# Patient Record
Sex: Male | Born: 1960 | Race: Black or African American | Hispanic: No | Marital: Single | Smoking: Never smoker
Health system: Southern US, Community
[De-identification: ages and names within clinical notes are randomized; demographics above are authoritative.]

## PROBLEM LIST (undated history)

## (undated) DIAGNOSIS — N529 Male erectile dysfunction, unspecified: Secondary | ICD-10-CM

## (undated) DIAGNOSIS — B1920 Unspecified viral hepatitis C without hepatic coma: Secondary | ICD-10-CM

## (undated) DIAGNOSIS — I10 Essential (primary) hypertension: Secondary | ICD-10-CM

## (undated) HISTORY — PX: CLOSED MANIPULATION SHOULDER: SUR205

---

## 2015-12-14 ENCOUNTER — Encounter: Payer: Self-pay | Admitting: Emergency Medicine

## 2015-12-14 ENCOUNTER — Emergency Department
Admission: EM | Admit: 2015-12-14 | Discharge: 2015-12-14 | Disposition: A | Discharge status: Home / Self Care | Payer: BLUE CROSS/BLUE SHIELD | Attending: Emergency Medicine | Admitting: Emergency Medicine

## 2015-12-14 ENCOUNTER — Emergency Department: Payer: BLUE CROSS/BLUE SHIELD

## 2015-12-14 DIAGNOSIS — S8011XD Contusion of right lower leg, subsequent encounter: Secondary | ICD-10-CM | POA: Insufficient documentation

## 2015-12-14 DIAGNOSIS — Y929 Unspecified place or not applicable: Secondary | ICD-10-CM | POA: Insufficient documentation

## 2015-12-14 DIAGNOSIS — Y999 Unspecified external cause status: Secondary | ICD-10-CM | POA: Diagnosis not present

## 2015-12-14 DIAGNOSIS — Y939 Activity, unspecified: Secondary | ICD-10-CM | POA: Insufficient documentation

## 2015-12-14 DIAGNOSIS — L989 Disorder of the skin and subcutaneous tissue, unspecified: Secondary | ICD-10-CM

## 2015-12-14 DIAGNOSIS — S8991XA Unspecified injury of right lower leg, initial encounter: Secondary | ICD-10-CM | POA: Diagnosis present

## 2015-12-14 MED ORDER — OXYCODONE HCL 5 MG PO TABS: 5.0000 mg | ORAL_TABLET | Freq: Three times a day (TID) | ORAL | Status: DC | PRN

## 2015-12-14 MED ORDER — KETOROLAC TROMETHAMINE 60 MG/2ML IM SOLN
30.0000 mg | Freq: Once | INTRAMUSCULAR | Status: AC
  Administered 2015-12-14: 30 mg via INTRAMUSCULAR
  Filled 2015-12-14: qty 2

## 2015-12-14 NOTE — ED Notes (Signed)
See triage   States he hit a tailgate with right lower leg couple of days ago. Was seen at [facility]  At that time  Was told he bruised the leg  Area still swollen and tender

## 2015-12-14 NOTE — ED Provider Notes (Signed)
[facility] Emergency Department Provider Note ____________________________________________  Time seen: Approximately 3:04 PM  I have reviewed the triage vital signs and the nursing notes.   HISTORY  Chief Complaint Leg Pain    HPI Andrew Hodge is a 55 y.o. male who presents to the emergency department for evaluation of right lower extremity pain. He states that he felt off the tailgate of a truck and struck his right lower leg about a week ago. He states that he was evaluated at [facility] clinic after the fall and was advised that he had a hematoma. His leg was wrapped in an Ace bandage, he was given crutches, and a prescription for Vicodin at that time. He states that the leg is still swelling and tender and the Vicodin is not relieving the pain. He denies new symptoms or new injury.  History reviewed. No pertinent past medical history.  There are no active problems to display for this patient.   History reviewed. No pertinent past surgical history.  Current Outpatient Rx  Name  Route  Sig  Dispense  Refill  . oxyCODONE (ROXICODONE) 5 MG immediate release tablet   Oral   Take 1 tablet (5 mg total) by mouth every 8 (eight) hours as needed.   12 tablet   0     Allergies Review of patient's allergies indicates no known allergies.  No family history on file.  Social History Social History  Substance Use Topics  . Smoking status: Never Smoker   . Smokeless tobacco: None  . Alcohol Use: No    Review of Systems Constitutional: No recent illness. Cardiovascular: Denies chest pain or palpitations. Respiratory: Denies shortness of breath. Musculoskeletal: Pain in Right lower extremity Skin: Negative for rash, wound, lesion. Neurological: Negative for focal weakness or numbness.  ____________________________________________   PHYSICAL EXAM:  VITAL SIGNS: ED Triage Vitals  Enc Vitals Group     BP 12/14/15 1221 149/94 mmHg     Pulse Rate  12/14/15 1221 56     Resp 12/14/15 1221 18     Temp 12/14/15 1221 98.1 F (36.7 C)     Temp Source 12/14/15 1221 Oral     SpO2 12/14/15 1221 99 %     Weight 12/14/15 1221 268 lb (121.564 kg)     Height 12/14/15 1221 5\' 7"  (1.702 m)     Head Cir --      Peak Flow --      Pain Score 12/14/15 1222 8     Pain Loc --      Pain Edu? --      Excl. in [place]? --     Constitutional: Alert and oriented. Well appearing and in no acute distress. Eyes: Conjunctivae are normal. EOMI. Head: Atraumatic. Neck: No stridor.  Respiratory: Normal respiratory effort.   Musculoskeletal: Nontender mobile lesion palpable on the right pretibial area consistent with reported injury. Neurologic:  Normal speech and language. No gross focal neurologic deficits are appreciated. Speech is normal. No gait instability. Skin: Healing abrasion noted central to the non-mobile lesion of the right lower extremity. No associated erythema. Lesion is nonfluctuant. Psychiatric: Mood and affect are normal. Speech and behavior are normal.  ____________________________________________   LABS (all labs ordered are listed, but only abnormal results are displayed)  Labs Reviewed - No data to display ____________________________________________  RADIOLOGY  4x 1 x 3 cm deep subcutaneous mass consistent with hematoma. ____________________________________________   PROCEDURES  Procedure(s) performed: None   ____________________________________________   INITIAL  IMPRESSION / ASSESSMENT AND PLAN / ED COURSE  Pertinent labs & imaging results that were available during my care of the patient were reviewed by me and considered in my medical decision making (see chart for details).  Patient was advised to continue rest, compression, ice and elevation. Patient is displeased that I am unable to make his leg better immediately with medication. Patient was advised to follow-up with the primary care provider to ensure resolution  of the hematoma. He was advised to return to the emergency department for symptoms that change or worsen if he is unable to schedule an appointment. Today he'll be given a prescription for Roxicet and advised to stop taking the Vicodin. ____________________________________________   FINAL CLINICAL IMPRESSION(S) / ED DIAGNOSES  Final diagnoses:  Skin lesion of right lower extremity  Hematoma of right lower extremity, subsequent encounter       Victorino Dike, FNP 12/14/15 [place] Yao, MD 12/18/15 [phone]

## 2015-12-14 NOTE — ED Notes (Signed)
Pt in w/c  Ace wrap applied to right lower leg  Explained the wait . States he was not going to stay much longer  PA aware

## 2015-12-14 NOTE — ED Notes (Signed)
[place] of tailgate of truck, struck right lower leg , hematoma noted no signs if infection , unable to put complete weight on his leg, uses crutches for comfort, sensation noted distally.

## 2015-12-24 ENCOUNTER — Emergency Department
Admission: EM | Admit: 2015-12-24 | Discharge: 2015-12-24 | Disposition: A | Discharge status: Home / Self Care | Payer: BLUE CROSS/BLUE SHIELD | Attending: Emergency Medicine | Admitting: Emergency Medicine

## 2015-12-24 ENCOUNTER — Encounter: Payer: Self-pay | Admitting: Emergency Medicine

## 2015-12-24 DIAGNOSIS — L03116 Cellulitis of left lower limb: Secondary | ICD-10-CM | POA: Diagnosis not present

## 2015-12-24 DIAGNOSIS — Z79899 Other long term (current) drug therapy: Secondary | ICD-10-CM | POA: Diagnosis not present

## 2015-12-24 DIAGNOSIS — L03119 Cellulitis of unspecified part of limb: Secondary | ICD-10-CM

## 2015-12-24 DIAGNOSIS — L02419 Cutaneous abscess of limb, unspecified: Secondary | ICD-10-CM

## 2015-12-24 DIAGNOSIS — L539 Erythematous condition, unspecified: Secondary | ICD-10-CM | POA: Diagnosis present

## 2015-12-24 MED ORDER — SULFAMETHOXAZOLE-TRIMETHOPRIM 800-160 MG PO TABS: 1.0000 | ORAL_TABLET | Freq: Two times a day (BID) | ORAL | Status: DC

## 2015-12-24 MED ORDER — SULFAMETHOXAZOLE-TRIMETHOPRIM 800-160 MG PO TABS
1.0000 | ORAL_TABLET | Freq: Once | ORAL | Status: AC
  Administered 2015-12-24: 1 via ORAL

## 2015-12-24 MED ORDER — TRAMADOL HCL 50 MG PO TABS: 50.0000 mg | ORAL_TABLET | Freq: Four times a day (QID) | ORAL | Status: DC | PRN

## 2015-12-24 MED ORDER — SULFAMETHOXAZOLE-TRIMETHOPRIM 800-160 MG PO TABS
ORAL_TABLET | ORAL | Status: AC
  Administered 2015-12-24: 1 via ORAL
  Filled 2015-12-24: qty 1

## 2015-12-24 NOTE — ED Notes (Signed)
Abscess R leg x 1 week, some drainage.

## 2015-12-24 NOTE — ED Notes (Signed)
States he fell from back of truck about 1 1/2 weeks ago hit right lower leg. States area is healing except he noticed some drainage yesterday  Area is swollen

## 2015-12-24 NOTE — ED Provider Notes (Addendum)
[facility] Emergency Department Provider Note   ____________________________________________  Time seen: Approximately 5:08 PM  I have reviewed the triage vital signs and the nursing notes.   HISTORY  Chief Complaint Abscess    HPI Andrew Hodge is a 55 y.o. male patient complain of drainage from  resolving hematoma. Patient state he fell from a truck about a week and half ago. Patient was seen in this ED had ultrasound done of the left lower extremity revealed a hematoma. Patient stated most of the swelling has resolved with ice packs and elevation. Patient stated the drainage started yesterday. Patient rates his pain discomfort as a 7/10. Patient is taking ibuprofen for this pain.  History reviewed. No pertinent past medical history.  There are no active problems to display for this patient.   History reviewed. No pertinent past surgical history.  Current Outpatient Rx  Name  Route  Sig  Dispense  Refill  . oxyCODONE (ROXICODONE) 5 MG immediate release tablet   Oral   Take 1 tablet (5 mg total) by mouth every 8 (eight) hours as needed.   12 tablet   0   . sulfamethoxazole-trimethoprim (BACTRIM DS,SEPTRA DS) 800-160 MG tablet   Oral   Take 1 tablet by mouth 2 (two) times daily.   20 tablet   0   . traMADol (ULTRAM) 50 MG tablet   Oral   Take 1 tablet (50 mg total) by mouth every 6 (six) hours as needed.   20 tablet   0     Allergies Review of patient's allergies indicates no known allergies.  No family history on file.  Social History Social History  Substance Use Topics  . Smoking status: Never Smoker   . Smokeless tobacco: None  . Alcohol Use: No    Review of Systems Constitutional: No fever/chills Eyes: No visual changes. ENT: No sore throat. Cardiovascular: Denies chest pain. Respiratory: Denies shortness of breath. Gastrointestinal: No abdominal pain.  No nausea, no vomiting.  No diarrhea.  No  constipation. Genitourinary: Negative for dysuria. Musculoskeletal: Negative for back pain. Skin: Negative for rash. Swelling and drainage Neurological: Negative for headaches, focal weakness or numbness.   ____________________________________________   PHYSICAL EXAM:  VITAL SIGNS: ED Triage Vitals  Enc Vitals Group     BP 12/24/15 1643 176/102 mmHg     Pulse Rate 12/24/15 1643 69     Resp 12/24/15 1643 20     Temp 12/24/15 1643 98.2 F (36.8 C)     Temp Source 12/24/15 1643 Oral     SpO2 12/24/15 1643 98 %     Weight 12/24/15 1643 267 lb (121.11 kg)     Height 12/24/15 1643 5\' 7"  (1.702 m)     Head Cir --      Peak Flow --      Pain Score 12/24/15 1644 7     Pain Loc --      Pain Edu? --      Excl. in [place]? --     Constitutional: Alert and oriented. Well appearing and in no acute distress. Eyes: Conjunctivae are normal. PERRL. EOMI. Head: Atraumatic. Nose: No congestion/rhinnorhea. Mouth/Throat: Mucous membranes are moist.  Oropharynx non-erythematous. Neck: No stridor.  No cervical spine tenderness to palpation. Hematological/Lymphatic/Immunilogical: No cervical lymphadenopathy. Cardiovascular: Normal rate, regular rhythm. Grossly normal heart sounds.  Good peripheral circulation. Respiratory: Normal respiratory effort.  No retractions. Lungs CTAB. Gastrointestinal: Soft and nontender. No distention. No abdominal bruits. No CVA tenderness. Musculoskeletal:  No lower extremity tenderness nor edema.  No joint effusions. Neurologic:  Normal speech and language. No gross focal neurologic deficits are appreciated. No gait instability. Skin:  Skin is warm, dry and intact. No rash noted.Mild edema and erythema left lower leg adjacent to resolving hematoma. Psychiatric: Mood and affect are normal. Speech and behavior are normal.  ____________________________________________   LABS (all labs ordered are listed, but only abnormal results are displayed)  Labs Reviewed - No  data to display ____________________________________________  EKG   ____________________________________________  RADIOLOGY   ____________________________________________   PROCEDURES  Procedure(s) performed: None  Critical Care performed: No  ____________________________________________   INITIAL IMPRESSION / ASSESSMENT AND PLAN / ED COURSE  Pertinent labs & imaging results that were available during my care of the patient were reviewed by me and considered in my medical decision making (see chart for details).  Cellulitis left lower leg. Patient given discharge Instructions. He is given a prescription for Bactrim DS and tramadol. ____________________________________________   FINAL CLINICAL IMPRESSION(S) / ED DIAGNOSES  Final diagnoses:  Cellulitis and abscess of leg, except foot      NEW MEDICATIONS STARTED DURING THIS VISIT:  Discharge Medication List as of 12/24/2015  5:20 PM    START taking these medications   Details  sulfamethoxazole-trimethoprim (BACTRIM DS,SEPTRA DS) 800-160 MG tablet Take 1 tablet by mouth 2 (two) times daily., Starting 12/24/2015, Until Discontinued, Print    traMADol (ULTRAM) 50 MG tablet Take 1 tablet (50 mg total) by mouth every 6 (six) hours as needed., Starting 12/24/2015, Until Tue 12/23/16, Print         Note:  This document was prepared using Dragon voice recognition software and may include unintentional dictation errors.    Sable Feil, PA-C 12/24/15 1719  Earleen Newport, MD 12/24/15 [place], PA-C 12/24/15 1853  Earleen Newport, MD 12/24/15 [phone]

## 2015-12-24 NOTE — Discharge Instructions (Signed)
Cellulitis °Cellulitis is an infection of the skin and the tissue under the skin. The infected area is usually red and tender. This happens most often in the arms and lower legs. °HOME CARE  °· Take your antibiotic medicine as told. Finish the medicine even if you start to feel better. °· Keep the infected arm or leg raised (elevated). °· Put a warm cloth on the area up to 4 times per day. °· Only take medicines as told by your doctor. °· Keep all doctor visits as told. °GET HELP IF: °· You see red streaks on the skin coming from the infected area. °· Your red area gets bigger or turns a dark color. °· Your bone or joint under the infected area is painful after the skin heals. °· Your infection comes back in the same area or different area. °· You have a puffy (swollen) bump in the infected area. °· You have new symptoms. °· You have a fever. °GET HELP RIGHT AWAY IF:  °· You feel very sleepy. °· You throw up (vomit) or have watery poop (diarrhea). °· You feel sick and have muscle aches and pains. °  °This information is not intended to replace advice given to you by your health care provider. Make sure you discuss any questions you have with your health care provider. °  °Document Released: 12/24/2007 Document Revised: 03/28/2015 Document Reviewed: 09/22/2011 °Elsevier Interactive Patient Education ©2016 Elsevier Inc. ° °

## 2016-06-04 ENCOUNTER — Ambulatory Visit
Admission: EM | Admit: 2016-06-04 | Discharge: 2016-06-04 | Disposition: A | Discharge status: Home / Self Care | Payer: BLUE CROSS/BLUE SHIELD | Attending: Family Medicine | Admitting: Family Medicine

## 2016-06-04 DIAGNOSIS — N528 Other male erectile dysfunction: Secondary | ICD-10-CM | POA: Diagnosis not present

## 2016-06-04 HISTORY — DX: Male erectile dysfunction, unspecified: N52.9

## 2016-06-04 MED ORDER — TADALAFIL 5 MG PO TABS: 5.0000 mg | ORAL_TABLET | Freq: Every day | ORAL | 0 refills | Status: DC | PRN

## 2016-06-04 MED ORDER — TADALAFIL 5 MG PO TABS
5.0000 mg | ORAL_TABLET | Freq: Every day | ORAL | 0 refills | Status: DC | PRN
Start: 2016-06-04 — End: 2016-06-04

## 2016-06-04 NOTE — ED Provider Notes (Signed)
[facility]    CSN: [id number] Arrival date & time: 06/04/16  1504     History   Chief Complaint Chief Complaint  Patient presents with  . Medication Refill    HPI Andrew Hodge is a 55 y.o. male.   55 yo male with a c/o erection problems. States he's had this problem for many months. Denies any urinary symptoms. Denies dysuria, hematuria, penile discharge, pain. States in the past he had tried viagra (given to him by a friend) with some improvement. States he does not have a Primary Care Provider and denies any other chronic medical problems.    The history is provided by the patient.    Past Medical History:  Diagnosis Date  . Impotence     There are no active problems to display for this patient.   History reviewed. No pertinent surgical history.     Home Medications    Prior to Admission medications   Medication Sig Start Date End Date Taking? Authorizing Provider  tadalafil (CIALIS) 5 MG tablet Take 1 tablet (5 mg total) by mouth daily as needed for erectile dysfunction. 06/04/16   Norval Gable, MD    Family History History reviewed. No pertinent family history.  Social History Social History  Substance Use Topics  . Smoking status: Never Smoker  . Smokeless tobacco: Never Used  . Alcohol use No     Allergies   Patient has no known allergies.   Review of Systems Review of Systems   Physical Exam Triage Vital Signs ED Triage Vitals  Enc Vitals Group     BP 06/04/16 1515 (!) 183/100     Pulse Rate 06/04/16 1515 72     Resp 06/04/16 1515 18     Temp 06/04/16 1515 98.4 F (36.9 C)     Temp Source 06/04/16 1515 Oral     SpO2 06/04/16 1515 99 %     Weight 06/04/16 1516 270 lb (122.5 kg)     Height 06/04/16 1516 5\' 7"  (1.702 m)     Head Circumference --      Peak Flow --      Pain Score 06/04/16 1517 0     Pain Loc --      Pain Edu? --      Excl. in [place]? --    No data found.   Updated Vital Signs BP (!) 142/82    Pulse 72   Temp 98.4 F (36.9 C) (Oral)   Resp 18   Ht 5\' 7"  (1.702 m)   Wt 270 lb (122.5 kg)   SpO2 99%   BMI 42.29 kg/m   Visual Acuity Right Eye Distance:   Left Eye Distance:   Bilateral Distance:    Right Eye Near:   Left Eye Near:    Bilateral Near:     Physical Exam  Constitutional: He appears well-developed and well-nourished. No distress.  Skin: He is not diaphoretic.  Nursing note and vitals reviewed.    UC Treatments / Results  Labs (all labs ordered are listed, but only abnormal results are displayed) Labs Reviewed - No data to display  EKG  EKG Interpretation None       Radiology No results found.  Procedures Procedures (including critical care time)  Medications Ordered in UC Medications - No data to display   Initial Impression / Assessment and Plan / UC Course  I have reviewed the triage vital signs and the nursing notes.  Pertinent labs & imaging  results that were available during my care of the patient were reviewed by me and considered in my medical decision making (see chart for details).  Clinical Course       Final Clinical Impressions(s) / UC Diagnoses   Final diagnoses:  Other male erectile dysfunction    New Prescriptions Discharge Medication List as of 06/04/2016  3:51 PM    START taking these medications   Details  tadalafil (CIALIS) 5 MG tablet Take 1 tablet (5 mg total) by mouth daily as needed for erectile dysfunction., Starting Wed 06/04/2016, Print       1. diagnosis reviewed with patient 2. rx as per orders above; reviewed possible side effects, interactions, risks and benefits; rx for #5 tabs  3. Recommend patient establish care with a Primary Care Provider for further work up and follow up    Norval Gable, MD 06/04/16 1610

## 2016-06-04 NOTE — ED Triage Notes (Addendum)
Pt wants to see a provider for prescription Viagra.

## 2016-06-26 ENCOUNTER — Encounter: Payer: Self-pay | Admitting: Internal Medicine

## 2016-07-01 ENCOUNTER — Encounter: Payer: Self-pay | Admitting: Internal Medicine

## 2016-07-01 ENCOUNTER — Ambulatory Visit (INDEPENDENT_AMBULATORY_CARE_PROVIDER_SITE_OTHER): Payer: BLUE CROSS/BLUE SHIELD | Admitting: Internal Medicine

## 2016-07-01 VITALS — BP 162/104 | HR 78 | Temp 97.5°F | Ht 67.0 in | Wt 281.0 lb

## 2016-07-01 DIAGNOSIS — I10 Essential (primary) hypertension: Secondary | ICD-10-CM | POA: Diagnosis not present

## 2016-07-01 DIAGNOSIS — N529 Male erectile dysfunction, unspecified: Secondary | ICD-10-CM

## 2016-07-01 NOTE — Patient Instructions (Signed)
DASH Eating Plan DASH stands for "Dietary Approaches to Stop Hypertension." The DASH eating plan is a healthy eating plan that has been shown to reduce high blood pressure (hypertension). Additional health benefits may include reducing the risk of type 2 diabetes mellitus, heart disease, and stroke. The DASH eating plan may also help with weight loss. What do I need to know about the DASH eating plan? For the DASH eating plan, you will follow these general guidelines:  Choose foods with less than 150 milligrams of sodium per serving (as listed on the food label).  Use salt-free seasonings or herbs instead of table salt or sea salt.  Check with your health care provider or pharmacist before using salt substitutes.  Eat lower-sodium products. These are often labeled as "low-sodium" or "no salt added."  Eat fresh foods. Avoid eating a lot of canned foods.  Eat more vegetables, fruits, and low-fat dairy products.  Choose whole grains. Look for the word "whole" as the first word in the ingredient list.  Choose fish and skinless chicken or turkey more often than red meat. Limit fish, poultry, and meat to 6 oz (170 g) each day.  Limit sweets, desserts, sugars, and sugary drinks.  Choose heart-healthy fats.  Eat more home-cooked food and less restaurant, buffet, and fast food.  Limit fried foods.  Do not fry foods. Cook foods using methods such as baking, boiling, grilling, and broiling instead.  When eating at a restaurant, ask that your food be prepared with less salt, or no salt if possible. What foods can I eat? Seek help from a dietitian for individual calorie needs. Grains  Whole grain or whole wheat bread. Brown rice. Whole grain or whole wheat pasta. Quinoa, bulgur, and whole grain cereals. Low-sodium cereals. Corn or whole wheat flour tortillas. Whole grain cornbread. Whole grain crackers. Low-sodium crackers. Vegetables  Fresh or frozen vegetables (raw, steamed, roasted, or  grilled). Low-sodium or reduced-sodium tomato and vegetable juices. Low-sodium or reduced-sodium tomato sauce and paste. Low-sodium or reduced-sodium canned vegetables. Fruits  All fresh, canned (in natural juice), or frozen fruits. Meat and Other Protein Products  Ground beef (85% or leaner), grass-fed beef, or beef trimmed of fat. Skinless chicken or turkey. Ground chicken or turkey. Pork trimmed of fat. All fish and seafood. Eggs. Dried beans, peas, or lentils. Unsalted nuts and seeds. Unsalted canned beans. Dairy  Low-fat dairy products, such as skim or 1% milk, 2% or reduced-fat cheeses, low-fat ricotta or cottage cheese, or plain low-fat yogurt. Low-sodium or reduced-sodium cheeses. Fats and Oils  Tub margarines without trans fats. Light or reduced-fat mayonnaise and salad dressings (reduced sodium). Avocado. Safflower, olive, or canola oils. Natural peanut or almond butter. Other  Unsalted popcorn and pretzels. The items listed above may not be a complete list of recommended foods or beverages. Contact your dietitian for more options.  What foods are not recommended? Grains  White bread. White pasta. White rice. Refined cornbread. Bagels and croissants. Crackers that contain trans fat. Vegetables  Creamed or fried vegetables. Vegetables in a cheese sauce. Regular canned vegetables. Regular canned tomato sauce and paste. Regular tomato and vegetable juices. Fruits  Canned fruit in light or heavy syrup. Fruit juice. Meat and Other Protein Products  Fatty cuts of meat. Ribs, chicken wings, bacon, sausage, bologna, salami, chitterlings, fatback, hot dogs, bratwurst, and packaged luncheon meats. Salted nuts and seeds. Canned beans with salt. Dairy  Whole or 2% milk, cream, half-and-half, and cream cheese. Whole-fat or sweetened yogurt. Full-fat cheeses   or blue cheese. Nondairy creamers and whipped toppings. Processed cheese, cheese spreads, or cheese curds. Condiments  Onion and garlic  salt, seasoned salt, table salt, and sea salt. Canned and packaged gravies. Worcestershire sauce. Tartar sauce. Barbecue sauce. Teriyaki sauce. Soy sauce, including reduced sodium. Steak sauce. Fish sauce. Oyster sauce. Cocktail sauce. Horseradish. Ketchup and mustard. Meat flavorings and tenderizers. Bouillon cubes. Hot sauce. Tabasco sauce. Marinades. Taco seasonings. Relishes. Fats and Oils  Butter, stick margarine, lard, shortening, ghee, and bacon fat. Coconut, palm kernel, or palm oils. Regular salad dressings. Other  Pickles and olives. Salted popcorn and pretzels. The items listed above may not be a complete list of foods and beverages to avoid. Contact your dietitian for more information.  Where can I find more information? National Heart, Lung, and Blood Institute: www.nhlbi.nih.gov/health/health-topics/topics/dash/ This information is not intended to replace advice given to you by your health care provider. Make sure you discuss any questions you have with your health care provider. Document Released: 06/26/2011 Document Revised: 12/13/2015 Document Reviewed: 05/11/2013 Elsevier Interactive Patient Education  2017 Elsevier Inc.  

## 2016-07-01 NOTE — Progress Notes (Signed)
    Date:  07/01/2016   Name:  Andrew Hodge   DOB:  15-Jun-1961   MRN:  [id number]   Chief Complaint: Establish Care Erectile Dysfunction  The current episode started more than 1 year ago. The problem has been gradually worsening since onset. The nature of his difficulty is maintaining erection. Pertinent negatives include no chills. Past treatments include nothing.  Hypertension  This is a new problem. The current episode started more than 1 month ago. The problem has been waxing and waning since onset. The problem is uncontrolled. Pertinent negatives include no chest pain, headaches, palpitations or shortness of breath. Associated agents: drinking an energy/protein shake every morning.      Review of Systems  Constitutional: Negative for chills, fatigue, fever and unexpected weight change.  Eyes: Negative for visual disturbance.  Respiratory: Negative for cough, chest tightness, shortness of breath and wheezing.   Cardiovascular: Negative for chest pain, palpitations and leg swelling.  Gastrointestinal: Negative for abdominal pain and diarrhea.  Endocrine: Negative for polydipsia and polyuria.  Genitourinary:       ED as in HPI  Musculoskeletal: Negative for arthralgias and gait problem.  Skin: Negative for color change and rash.  Neurological: Negative for dizziness and headaches.  Psychiatric/Behavioral: Negative for sleep disturbance.    Patient Active Problem List   Diagnosis Date Noted  . Obesity 06/26/2016    Prior to Admission medications   Medication Sig Start Date End Date Taking? Authorizing Provider  tadalafil (CIALIS) 5 MG tablet Take 1 tablet (5 mg total) by mouth daily as needed for erectile dysfunction. 06/04/16   Norval Gable, MD    No Known Allergies  History reviewed. No pertinent surgical history.  Social History  Substance Use Topics  . Smoking status: Never Smoker  . Smokeless tobacco: Never Used  . Alcohol use Yes     Medication list has  been reviewed and updated.   Physical Exam  Constitutional: He is oriented to person, place, and time. He appears well-developed. No distress.  HENT:  Head: Normocephalic and atraumatic.  Neck: Normal range of motion. Neck supple. Carotid bruit is not present. No thyromegaly present.  Cardiovascular: Normal rate, regular rhythm and normal heart sounds.   Pulmonary/Chest: Effort normal and breath sounds normal. No respiratory distress.  Abdominal: Soft. Normal appearance.  Musculoskeletal: Normal range of motion. He exhibits no edema.  Neurological: He is alert and oriented to person, place, and time. He has normal reflexes.  Skin: Skin is warm and dry. No rash noted.  Psychiatric: He has a normal mood and affect. His behavior is normal. Thought content normal.  Nursing note and vitals reviewed.   BP (!) 162/104   Pulse 78   Temp 97.5 F (36.4 C)   Ht 5\' 7"  (1.702 m)   Wt 281 lb (127.5 kg)   SpO2 98%   BMI 44.01 kg/m   Assessment and Plan: 1. Hypertension, unspecified type Stop protein shakes Follow up in one week - CBC with Differential/Platelet - Comprehensive metabolic panel - TSH - Lipid panel  2. Erectile dysfunction, unspecified erectile dysfunction type Consider cialis or similar if labs and BP improved - Testosterone   Halina Maidens, MD [facility]  07/01/2016

## 2016-07-02 LAB — LIPID PANEL
Chol/HDL Ratio: 3.1 ratio units (ref 0.0–5.0)
Cholesterol, Total: 210 mg/dL — ABNORMAL HIGH (ref 100–199)
HDL: 68 mg/dL (ref >39)
LDL CALC: 116 mg/dL — AB (ref 0–99)
Triglycerides: 130 mg/dL (ref 0–149)
VLDL CHOLESTEROL CAL: 26 mg/dL (ref 5–40)

## 2016-07-02 LAB — COMPREHENSIVE METABOLIC PANEL
A/G RATIO: 1.4 (ref 1.2–2.2)
ALBUMIN: 4.2 g/dL (ref 3.5–5.5)
ALK PHOS: 95 IU/L (ref 39–117)
ALT: 41 IU/L (ref 0–44)
AST: 28 IU/L (ref 0–40)
BUN / CREAT RATIO: 20 (ref 9–20)
BUN: 17 mg/dL (ref 6–24)
Bilirubin Total: 0.4 mg/dL (ref 0.0–1.2)
CO2: 25 mmol/L (ref 18–29)
CREATININE: 0.84 mg/dL (ref 0.76–1.27)
Calcium: 9.5 mg/dL (ref 8.7–10.2)
Chloride: 103 mmol/L (ref 96–106)
GFR calc Af Amer: 115 mL/min/{1.73_m2} (ref >59)
GFR calc non Af Amer: 99 mL/min/{1.73_m2} (ref >59)
GLOBULIN, TOTAL: 3 g/dL (ref 1.5–4.5)
Glucose: 80 mg/dL (ref 65–99)
POTASSIUM: 4.4 mmol/L (ref 3.5–5.2)
SODIUM: 141 mmol/L (ref 134–144)
Total Protein: 7.2 g/dL (ref 6.0–8.5)

## 2016-07-02 LAB — TSH: TSH: 1.62 u[IU]/mL (ref 0.450–4.500)

## 2016-07-02 LAB — CBC WITH DIFFERENTIAL/PLATELET
BASOS: 1 %
Basophils Absolute: 0 10*3/uL (ref 0.0–0.2)
EOS (ABSOLUTE): 0.1 10*3/uL (ref 0.0–0.4)
EOS: 2 %
HEMATOCRIT: 42.4 % (ref 37.5–51.0)
HEMOGLOBIN: 14.9 g/dL (ref 13.0–17.7)
Immature Grans (Abs): 0 10*3/uL (ref 0.0–0.1)
Immature Granulocytes: 1 %
LYMPHS ABS: 1.5 10*3/uL (ref 0.7–3.1)
Lymphs: 34 %
MCH: 29.6 pg (ref 26.6–33.0)
MCHC: 35.1 g/dL (ref 31.5–35.7)
MCV: 84 fL (ref 79–97)
MONOCYTES: 16 %
MONOS ABS: 0.7 10*3/uL (ref 0.1–0.9)
NEUTROS ABS: 2.1 10*3/uL (ref 1.4–7.0)
Neutrophils: 46 %
Platelets: 214 10*3/uL (ref 150–379)
RBC: 5.04 x10E6/uL (ref 4.14–5.80)
RDW: 13.9 % (ref 12.3–15.4)
WBC: 4.4 10*3/uL (ref 3.4–10.8)

## 2016-07-02 LAB — TESTOSTERONE: TESTOSTERONE: 500 ng/dL (ref 264–916)

## 2016-07-07 ENCOUNTER — Ambulatory Visit: Payer: BLUE CROSS/BLUE SHIELD | Admitting: Internal Medicine

## 2016-07-08 ENCOUNTER — Ambulatory Visit (INDEPENDENT_AMBULATORY_CARE_PROVIDER_SITE_OTHER): Payer: BLUE CROSS/BLUE SHIELD | Admitting: Internal Medicine

## 2016-07-08 ENCOUNTER — Encounter: Payer: Self-pay | Admitting: Internal Medicine

## 2016-07-08 VITALS — BP 150/90 | HR 70 | Temp 98.2°F | Ht 67.0 in | Wt 273.0 lb

## 2016-07-08 DIAGNOSIS — N528 Other male erectile dysfunction: Secondary | ICD-10-CM | POA: Diagnosis not present

## 2016-07-08 DIAGNOSIS — I10 Essential (primary) hypertension: Secondary | ICD-10-CM | POA: Diagnosis not present

## 2016-07-08 MED ORDER — TADALAFIL 5 MG PO TABS: 5.0000 mg | ORAL_TABLET | Freq: Every day | ORAL | 0 refills | Status: DC

## 2016-07-08 MED ORDER — IRBESARTAN 150 MG PO TABS: 150.0000 mg | ORAL_TABLET | Freq: Every day | ORAL | 2 refills | Status: DC

## 2016-07-08 NOTE — Progress Notes (Signed)
Date:  07/08/2016   Name:  Andrew Hodge   DOB:  08-Sep-1960   MRN:  [id number]   Chief Complaint: Hypertension and Erectile Dysfunction Hypertension  This is a new problem. The current episode started 1 to 4 weeks ago. The problem is uncontrolled. Pertinent negatives include no chest pain, headaches, palpitations or shortness of breath.  Erectile Dysfunction  Irritative symptoms do not include frequency. Pertinent negatives include no chills or dysuria.  He tried viagra but only had an erection for 10 minutes.  The same with Cialis 20 mg.  He wants something that might last longer.  We discussed trying daily cialis 5 mg. He stopped the protein energy shakes and is drinking more water.  He has started running and exercising more and has lost 8 lbs.  He feels well.  His BP has still been over 140 but usually around 150.  Lab Results  Component Value Date   TESTOSTERONE 500 07/01/2016   Lab Results  Component Value Date   TSH 1.620 07/01/2016   Lab Results  Component Value Date   CREATININE 0.84 07/01/2016     Review of Systems  Constitutional: Negative for chills, fever and unexpected weight change (lost 8 lbs).  Respiratory: Negative for cough, chest tightness, shortness of breath and wheezing.   Cardiovascular: Negative for chest pain, palpitations and leg swelling.  Gastrointestinal: Negative for constipation.  Genitourinary: Negative for dysuria and frequency.       ED - erection only lasts 10 minutes after Cialis  Musculoskeletal: Negative for arthralgias and gait problem.  Neurological: Negative for dizziness and headaches.    Patient Active Problem List   Diagnosis Date Noted  . Obesity 06/26/2016    Prior to Admission medications   Medication Sig Start Date End Date Taking? Authorizing Provider  tadalafil (CIALIS) 5 MG tablet Take 1 tablet (5 mg total) by mouth daily as needed for erectile dysfunction. 06/04/16  Yes Norval Gable, MD    No Known  Allergies  No past surgical history on file.  Social History  Substance Use Topics  . Smoking status: Never Smoker  . Smokeless tobacco: Never Used  . Alcohol use Yes     Medication list has been reviewed and updated.   Physical Exam  Constitutional: He is oriented to person, place, and time. He appears well-developed. No distress.  HENT:  Head: Normocephalic and atraumatic.  Cardiovascular: Normal rate, regular rhythm and normal heart sounds.   Pulmonary/Chest: Effort normal and breath sounds normal. No respiratory distress.  Musculoskeletal: Normal range of motion.  Neurological: He is alert and oriented to person, place, and time.  Skin: Skin is warm and dry. No rash noted.  Psychiatric: He has a normal mood and affect. His behavior is normal. Thought content normal.  Nursing note and vitals reviewed.   BP (!) 150/90   Pulse 70   Temp 98.2 F (36.8 C)   Ht 5\' 7"  (1.702 m)   Wt 273 lb (123.8 kg)   SpO2 98%   BMI 42.76 kg/m   Assessment and Plan: 1. Essential hypertension Begin medication Recheck in 2 months - irbesartan (AVAPRO) 150 MG tablet; Take 1 tablet (150 mg total) by mouth daily.  Dispense: 30 tablet; Refill: 2  2. Other male erectile dysfunction Try cialis 5 mg daily - samples given; call for RF if helpful - tadalafil (CIALIS) 5 MG tablet; Take 1 tablet (5 mg total) by mouth daily.  Dispense: 30 tablet; Refill: 0  Halina Maidens, MD [facility]  07/08/2016

## 2016-08-22 ENCOUNTER — Ambulatory Visit
Admission: EM | Admit: 2016-08-22 | Discharge: 2016-08-22 | Disposition: A | Discharge status: Home / Self Care | Payer: BLUE CROSS/BLUE SHIELD | Attending: Internal Medicine | Admitting: Internal Medicine

## 2016-08-22 DIAGNOSIS — Z024 Encounter for examination for driving license: Secondary | ICD-10-CM

## 2016-08-22 HISTORY — DX: Essential (primary) hypertension: I10

## 2016-08-22 LAB — DEPT OF TRANSP DIPSTICK, URINE (ARMC ONLY)
BILIRUBIN URINE: NEGATIVE
Glucose, UA: NEGATIVE mg/dL
Hgb urine dipstick: NEGATIVE
KETONES UR: NEGATIVE mg/dL
Leukocytes, UA: NEGATIVE
Nitrite: NEGATIVE
Protein, ur: NEGATIVE mg/dL
Specific Gravity, Urine: 1.01 (ref 1.005–1.030)
pH: 7 (ref 5.0–8.0)

## 2016-08-22 NOTE — ED Provider Notes (Signed)
CSN: [id number]     Arrival date & time 08/22/16  1146 History   First MD Initiated Contact with Patient 08/22/16 1302     Chief Complaint  Patient presents with  . Commercial Driver's License Exam   (Consider location/radiation/quality/duration/timing/severity/associated sxs/prior Treatment) HPI  Subjective 56 year old male who presents for a DOT physical. Is a history of hypertension which is currently being treated by Dr. Army Melia. He has been treated by Dr. Army Melia since December 2016. He also has a history of impotence and has been issued a prescription for medications but has not been using the medication as yet.     Past Medical History:  Diagnosis Date  . Hypertension   . Impotence    History reviewed. No pertinent surgical history. Family History  Problem Relation Age of Onset  . Diabetes Father    Social History  Substance Use Topics  . Smoking status: Never Smoker  . Smokeless tobacco: Never Used  . Alcohol use Yes    Review of Systems  All other systems reviewed and are negative.   Allergies  Patient has no known allergies.  Home Medications   Prior to Admission medications   Medication Sig Start Date End Date Taking? Authorizing Provider  irbesartan (AVAPRO) 150 MG tablet Take 1 tablet (150 mg total) by mouth daily. 07/08/16   Glean Hess, MD  tadalafil (CIALIS) 5 MG tablet Take 1 tablet (5 mg total) by mouth daily. 07/08/16   Glean Hess, MD   Meds Ordered and Administered this Visit  Medications - No data to display  BP (!) 149/99 (BP Location: Right Arm)   Pulse 60   Temp 98 F (36.7 C) (Oral)   Resp 20   Ht 5\' 11"  (1.803 m)   Wt 278 lb (126.1 kg)   SpO2 100%   BMI 38.77 kg/m  No data found.   Physical Exam  Constitutional:  Referred to DOT physical sheet  Nursing note and vitals reviewed.   Urgent Care Course     Procedures (including critical care time)  Labs Review Labs Reviewed  DEPT OF TRANSP DIPSTICK,  URINE([facility] ONLY)    Imaging Review No results found.   Visual Acuity Review  Right Eye Distance:   Left Eye Distance:   Bilateral Distance:    Right Eye Near:   Left Eye Near:    Bilateral Near:         MDM   1. Driver's permit PE (physical examination)    Several attempts to recheck his blood pressure were made utilizing a manual cuff but the patient continued to have  pressures above regulations. Told the patient that he must have the blood pressure within the normal range. He will need to follow-up with Dr. Army Melia to have his pressures regulated adequately. I have reiterated to him that at his return appointment within the next 3 months his pressures must be at or below 140/90. If it is not he will not be eligible for a certificate until his blood pressures are maintained below the regulation. He acknowledged understanding what I told him. He also knows that at his follow-up visit  that he may be eligible for  1 year certificate from today's date.if His blood pressures are within the guidelines.    Lorin Picket, PA-C 08/22/16 1407

## 2016-08-22 NOTE — ED Triage Notes (Signed)
DOT Physical

## 2016-08-28 ENCOUNTER — Ambulatory Visit (INDEPENDENT_AMBULATORY_CARE_PROVIDER_SITE_OTHER): Payer: BLUE CROSS/BLUE SHIELD | Admitting: Internal Medicine

## 2016-08-28 ENCOUNTER — Encounter: Payer: Self-pay | Admitting: Internal Medicine

## 2016-08-28 VITALS — BP 156/98 | HR 73 | Temp 97.8°F | Ht 71.0 in | Wt 273.0 lb

## 2016-08-28 DIAGNOSIS — N529 Male erectile dysfunction, unspecified: Secondary | ICD-10-CM

## 2016-08-28 DIAGNOSIS — I10 Essential (primary) hypertension: Secondary | ICD-10-CM | POA: Diagnosis not present

## 2016-08-28 NOTE — Progress Notes (Signed)
    Date:  08/28/2016   Name:  Andrew Hodge   DOB:  [birth year]   MRN:  [id number]   Chief Complaint: Hypertension Hypertension  This is a new problem. The problem has been gradually improving since onset. The problem is uncontrolled. Pertinent negatives include no chest pain, headaches, palpitations, peripheral edema or shortness of breath. Past treatments include angiotensin blockers. The current treatment provides mild improvement. There are no compliance problems.   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  ED - tried daily Cialis which gave him a good initial erection but it only lasts 10 minutes.  He quit taking it because it was not what he expected. He would like to try viagra.   Review of Systems  Constitutional: Negative for diaphoresis and unexpected weight change.  Respiratory: Negative for chest tightness, shortness of breath and wheezing.   Cardiovascular: Negative for chest pain, palpitations and leg swelling.  Gastrointestinal: Negative for abdominal pain.  Genitourinary:       ED  Neurological: Negative for dizziness, numbness and headaches.  Psychiatric/Behavioral: Negative for dysphoric mood. The patient is not nervous/anxious.     Patient Active Problem List   Diagnosis Date Noted  . Hypertension 07/08/2016  . Obesity 06/26/2016    Prior to Admission medications   Medication Sig Start Date End Date Taking? Authorizing Provider  irbesartan (AVAPRO) 150 MG tablet Take 1 tablet (150 mg total) by mouth daily. 07/08/16  Yes Glean Hess, MD  tadalafil (CIALIS) 5 MG tablet Take 1 tablet (5 mg total) by mouth daily. 07/08/16  Yes Glean Hess, MD    No Known Allergies  No past surgical history on file.  Social History  Substance Use Topics  . Smoking status: Never Smoker  . Smokeless tobacco: Never Used  . Alcohol use Yes     Medication list has been reviewed and updated.   Physical Exam  Constitutional: He is oriented to  person, place, and time. He appears well-developed. No distress.  HENT:  Head: Normocephalic and atraumatic.  Neck: Normal range of motion. Neck supple.  Cardiovascular: Normal rate, regular rhythm and normal heart sounds.   Pulmonary/Chest: Effort normal and breath sounds normal. No respiratory distress. He has no wheezes.  Musculoskeletal: Normal range of motion. He exhibits no edema.  Neurological: He is alert and oriented to person, place, and time.  Skin: Skin is warm and dry. No rash noted.  Psychiatric: He has a normal mood and affect. His behavior is normal. Thought content normal.  Nursing note and vitals reviewed.   BP (!) 156/98   Pulse 73   Temp 97.8 F (36.6 C)   Ht 5\' 11"  (1.803 m)   Wt 273 lb (123.8 kg)   SpO2 97%   BMI 38.08 kg/m   Assessment and Plan: 1. Essential hypertension Not controlled - will increase dose of Avapro  2. Erectile dysfunction, unspecified erectile dysfunction type Stop Cialis Viagra Rx sent (may need PA)   Halina Maidens, MD [facility]  08/28/2016

## 2016-09-01 ENCOUNTER — Other Ambulatory Visit: Payer: Self-pay | Admitting: Internal Medicine

## 2016-09-01 ENCOUNTER — Telehealth: Payer: Self-pay | Admitting: Internal Medicine

## 2016-09-01 DIAGNOSIS — I10 Essential (primary) hypertension: Secondary | ICD-10-CM

## 2016-09-01 MED ORDER — SILDENAFIL CITRATE 100 MG PO TABS: 50.0000 mg | ORAL_TABLET | Freq: Every day | ORAL | 11 refills | Status: DC | PRN

## 2016-09-01 MED ORDER — IRBESARTAN 300 MG PO TABS: 300.0000 mg | ORAL_TABLET | Freq: Every day | ORAL | 5 refills | Status: DC

## 2016-09-01 NOTE — Telephone Encounter (Signed)
Rx sent to pharmacy   

## 2016-09-01 NOTE — Telephone Encounter (Signed)
Pt need Rx viagra send to [facility]

## 2016-09-03 ENCOUNTER — Telehealth: Payer: Self-pay

## 2016-09-03 NOTE — Telephone Encounter (Signed)
Pt called requesting Prior Authorization. I sent one through cover my meds, and the Medication (VIAGRA) was not approved. Called pt and informed him of denial.

## 2016-10-01 ENCOUNTER — Ambulatory Visit (INDEPENDENT_AMBULATORY_CARE_PROVIDER_SITE_OTHER): Payer: BLUE CROSS/BLUE SHIELD | Admitting: Internal Medicine

## 2016-10-01 ENCOUNTER — Encounter: Payer: Self-pay | Admitting: Internal Medicine

## 2016-10-01 VITALS — BP 148/92 | HR 66 | Ht 70.0 in | Wt 276.2 lb

## 2016-10-01 DIAGNOSIS — I10 Essential (primary) hypertension: Secondary | ICD-10-CM | POA: Diagnosis not present

## 2016-10-01 MED ORDER — IRBESARTAN 150 MG PO TABS: 150.0000 mg | ORAL_TABLET | Freq: Every day | ORAL | 5 refills | Status: DC

## 2016-10-01 MED ORDER — AMLODIPINE BESYLATE 5 MG PO TABS
5.0000 mg | ORAL_TABLET | Freq: Every day | ORAL | 5 refills | Status: DC
Start: 2016-10-01 — End: 2017-04-05

## 2016-10-01 NOTE — Progress Notes (Signed)
    Date:  10/01/2016   Name:  Andrew Hodge   DOB:  1961/04/20   MRN:  [id number]   Chief Complaint: Hypertension (Having difficulty taking 300mg . Makes him feel "like medication is too strong." Pt is only taking 150mg .)  Hypertension  This is a chronic problem. The current episode started more than 1 year ago. The problem is unchanged. The problem is uncontrolled. Pertinent negatives include no chest pain, headaches, palpitations, peripheral edema or shortness of breath. There are no associated agents to hypertension.  He took 300 mg Avapro for only 2 days - felt very dizzy so went back to 150 mg.  He is not checking his BP.   Review of Systems  Constitutional: Negative for diaphoresis and fever.  Respiratory: Negative for cough, chest tightness and shortness of breath.   Cardiovascular: Negative for chest pain, palpitations and leg swelling.  Gastrointestinal: Negative for abdominal pain.  Neurological: Negative for dizziness, seizures, weakness, light-headedness and headaches.  Hematological: Negative for adenopathy.    Patient Active Problem List   Diagnosis Date Noted  . Hypertension 07/08/2016  . Obesity 06/26/2016    Prior to Admission medications   Medication Sig Start Date End Date Taking? Authorizing Provider  irbesartan (AVAPRO) 300 MG tablet Take 1 tablet (300 mg total) by mouth daily. 09/01/16  Yes Glean Hess, MD  sildenafil (VIAGRA) 100 MG tablet Take 0.5-1 tablets (50-100 mg total) by mouth daily as needed for erectile dysfunction. 09/01/16  Yes Glean Hess, MD    No Known Allergies  History reviewed. No pertinent surgical history.  Social History  Substance Use Topics  . Smoking status: Never Smoker  . Smokeless tobacco: Never Used  . Alcohol use Yes     Medication list has been reviewed and updated.   Physical Exam  Constitutional: He is oriented to person, place, and time. He appears well-developed. No distress.  HENT:  Head:  Normocephalic and atraumatic.  Pulmonary/Chest: Effort normal. No respiratory distress.  Musculoskeletal: Normal range of motion.  Neurological: He is alert and oriented to person, place, and time.  Skin: Skin is warm and dry. No rash noted.  Psychiatric: He has a normal mood and affect. His behavior is normal. Thought content normal.  Nursing note and vitals reviewed.   BP (!) 148/92   Pulse 66   Ht 5\' 10"  (1.778 m)   Wt 276 lb 3.2 oz (125.3 kg)   SpO2 99%   BMI 39.63 kg/m   Assessment and Plan: 1. Essential hypertension Intolerant of high dose Avapro Will continue 150 mg Avapro and add amlodipine 5 mg Recheck in 6 weeks - irbesartan (AVAPRO) 150 MG tablet; Take 1 tablet (150 mg total) by mouth daily.  Dispense: 30 tablet; Refill: 5   Meds ordered this encounter  Medications  . irbesartan (AVAPRO) 150 MG tablet    Sig: Take 1 tablet (150 mg total) by mouth daily.    Dispense:  30 tablet    Refill:  5  . amLODipine (NORVASC) 5 MG tablet    Sig: Take 1 tablet (5 mg total) by mouth daily.    Dispense:  30 tablet    Refill:  Vander, MD [facility]  10/01/2016

## 2016-10-05 ENCOUNTER — Other Ambulatory Visit: Payer: Self-pay | Admitting: Internal Medicine

## 2016-10-05 DIAGNOSIS — I10 Essential (primary) hypertension: Secondary | ICD-10-CM

## 2016-11-12 ENCOUNTER — Encounter: Payer: Self-pay | Admitting: Internal Medicine

## 2016-11-12 ENCOUNTER — Ambulatory Visit (INDEPENDENT_AMBULATORY_CARE_PROVIDER_SITE_OTHER): Payer: BLUE CROSS/BLUE SHIELD | Admitting: Internal Medicine

## 2016-11-12 VITALS — BP 128/82 | HR 60 | Ht 70.0 in | Wt 273.2 lb

## 2016-11-12 DIAGNOSIS — I10 Essential (primary) hypertension: Secondary | ICD-10-CM | POA: Diagnosis not present

## 2016-11-12 NOTE — Progress Notes (Signed)
    Date:  11/12/2016   Name:  Andrew Hodge   DOB:  [birth year]   MRN:  [id number]   Chief Complaint: Hypertension Hypertension  This is a chronic problem. The problem has been gradually improving since onset. Pertinent negatives include no chest pain, headaches, palpitations or shortness of breath. Past treatments include calcium channel blockers and angiotensin blockers. The current treatment provides moderate improvement. There are no compliance problems.     BP Readings from Last 3 Encounters:  11/12/16 128/82  10/01/16 (!) 148/92  08/28/16 (!) 156/98     Review of Systems  Constitutional: Negative for chills, fatigue and fever.  Respiratory: Negative for cough, chest tightness, shortness of breath and wheezing.   Cardiovascular: Negative for chest pain and palpitations.  Gastrointestinal: Negative for abdominal pain and constipation.  Musculoskeletal: Negative for arthralgias.  Neurological: Negative for dizziness and headaches.  Hematological: Negative for adenopathy.    Patient Active Problem List   Diagnosis Date Noted  . Hypertension 07/08/2016  . Obesity 06/26/2016    Prior to Admission medications   Medication Sig Start Date End Date Taking? Authorizing Provider  amLODipine (NORVASC) 5 MG tablet Take 1 tablet (5 mg total) by mouth daily. 10/01/16  Yes Glean Hess, MD  irbesartan (AVAPRO) 150 MG tablet Take 1 tablet (150 mg total) by mouth daily. 10/01/16  Yes Glean Hess, MD  sildenafil (VIAGRA) 100 MG tablet Take 0.5-1 tablets (50-100 mg total) by mouth daily as needed for erectile dysfunction. 09/01/16  Yes Glean Hess, MD    No Known Allergies  History reviewed. No pertinent surgical history.  Social History  Substance Use Topics  . Smoking status: Never Smoker  . Smokeless tobacco: Never Used  . Alcohol use Yes     Medication list has been reviewed and updated.   Physical Exam  Constitutional: He is oriented to person, place, and  time. He appears well-developed. No distress.  HENT:  Head: Normocephalic and atraumatic.  Cardiovascular: Normal rate, regular rhythm and normal heart sounds.   Pulmonary/Chest: Effort normal and breath sounds normal. No respiratory distress. He has no wheezes.  Musculoskeletal: Normal range of motion.  Neurological: He is alert and oriented to person, place, and time.  Skin: Skin is warm and dry. No rash noted.  Psychiatric: He has a normal mood and affect. His behavior is normal. Thought content normal.  Nursing note and vitals reviewed.   BP 128/82   Pulse 60   Ht 5\' 10"  (1.778 m)   Wt 273 lb 3.2 oz (123.9 kg)   SpO2 98%   BMI 39.20 kg/m   Assessment and Plan: 1. 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   Halina Maidens, MD [facility]  11/12/2016

## 2016-11-27 ENCOUNTER — Ambulatory Visit
Admission: EM | Admit: 2016-11-27 | Discharge: 2016-11-27 | Discharge status: Absent without leave | Payer: No Typology Code available for payment source

## 2017-01-23 ENCOUNTER — Other Ambulatory Visit: Payer: Self-pay

## 2017-01-23 DIAGNOSIS — I10 Essential (primary) hypertension: Secondary | ICD-10-CM

## 2017-01-23 MED ORDER — IRBESARTAN 150 MG PO TABS: 150.0000 mg | ORAL_TABLET | Freq: Every day | ORAL | 5 refills | Status: DC

## 2017-01-23 NOTE — Telephone Encounter (Signed)
Received fax from [facility]- stating patient requesting refill for Irbesartan 150 mg tablets- 1 tab PO daily. Please advise.

## 2017-04-05 ENCOUNTER — Other Ambulatory Visit: Payer: Self-pay | Admitting: Internal Medicine

## 2017-04-16 ENCOUNTER — Encounter: Payer: Self-pay | Admitting: Internal Medicine

## 2017-04-16 ENCOUNTER — Ambulatory Visit (INDEPENDENT_AMBULATORY_CARE_PROVIDER_SITE_OTHER): Payer: BLUE CROSS/BLUE SHIELD | Admitting: Internal Medicine

## 2017-04-16 VITALS — BP 124/80 | HR 67 | Ht 70.0 in | Wt 271.0 lb

## 2017-04-16 DIAGNOSIS — I10 Essential (primary) hypertension: Secondary | ICD-10-CM | POA: Diagnosis not present

## 2017-04-16 MED ORDER — AMLODIPINE BESYLATE 5 MG PO TABS: 5.0000 mg | ORAL_TABLET | Freq: Every day | ORAL | 5 refills | Status: DC

## 2017-04-16 NOTE — Progress Notes (Signed)
    Date:  04/16/2017   Name:  Andrew Hodge   DOB:  01/19/1961   MRN:  [id number]   Chief Complaint: Hypertension Hypertension  This is a chronic problem. The problem has been waxing and waning since onset. Pertinent negatives include no chest pain, headaches or shortness of breath.  He is concerned because blood pressure seems to be up and down. On close questioning he states sometimes it's in the 140s a very occasionally up to 150. He says he is taking his medication regularly without any side effects. He thinks he is following a rape relatively low-salt diet but admits to eating smoked sausage and condiment such as hot sauce that contains a lot of sodium.    Review of Systems  Constitutional: Negative for chills, fatigue and fever.  Respiratory: Negative for chest tightness and shortness of breath.   Cardiovascular: Negative for chest pain and leg swelling.  Neurological: Negative for dizziness and headaches.    Patient Active Problem List   Diagnosis Date Noted  . Hypertension 07/08/2016  . Obesity 06/26/2016    Prior to Admission medications   Medication Sig Start Date End Date Taking? Authorizing Provider  amLODipine (NORVASC) 5 MG tablet TAKE 1 TABLET BY MOUTH DAILY 04/05/17  Yes Glean Hess, MD  irbesartan (AVAPRO) 150 MG tablet Take 1 tablet (150 mg total) by mouth daily. 01/23/17  Yes Glean Hess, MD  sildenafil (VIAGRA) 100 MG tablet Take 0.5-1 tablets (50-100 mg total) by mouth daily as needed for erectile dysfunction. 09/01/16  Yes Glean Hess, MD    No Known Allergies  History reviewed. No pertinent surgical history.  Social History  Substance Use Topics  . Smoking status: Never Smoker  . Smokeless tobacco: Never Used  . Alcohol use Yes     Medication list has been reviewed and updated.  PHQ 2/9 Scores 07/01/2016  PHQ - 2 Score 0    Physical Exam  Constitutional: He is oriented to person, place, and time. He appears well-developed. No  distress.  HENT:  Head: Normocephalic and atraumatic.  Neck: Normal range of motion. Neck supple.  Cardiovascular: Normal rate, regular rhythm and normal heart sounds.   Pulmonary/Chest: Effort normal and breath sounds normal. No respiratory distress. He has no wheezes.  Musculoskeletal: Normal range of motion. He exhibits no edema.  Neurological: He is alert and oriented to person, place, and time.  Skin: Skin is warm and dry. No rash noted.  Psychiatric: He has a normal mood and affect. His behavior is normal. Thought content normal.  Nursing note and vitals reviewed.   BP 124/80 (BP Location: Right Arm, Patient Position: Sitting, Cuff Size: Large)   Pulse 67   Ht 5\' 10"  (1.778 m)   Wt 271 lb (122.9 kg)   SpO2 97%   BMI 38.88 kg/m   Assessment and Plan: 1. Essential hypertension Well controlled May improve with stricter limitation of sodium intake - amLODipine (NORVASC) 5 MG tablet; Take 1 tablet (5 mg total) by mouth daily.  Dispense: 30 tablet; Refill: 5   Meds ordered this encounter  Medications  . amLODipine (NORVASC) 5 MG tablet    Sig: Take 1 tablet (5 mg total) by mouth daily.    Dispense:  30 tablet    Refill:  5    Partially dictated using Editor, commissioning. Any errors are unintentional.  Halina Maidens, MD [facility]  04/16/2017

## 2017-04-16 NOTE — Patient Instructions (Signed)
DASH Eating Plan DASH stands for "Dietary Approaches to Stop Hypertension." The DASH eating plan is a healthy eating plan that has been shown to reduce high blood pressure (hypertension). It may also reduce your risk for type 2 diabetes, heart disease, and stroke. The DASH eating plan may also help with weight loss. What are tips for following this plan? General guidelines  Avoid eating more than 2,300 mg (milligrams) of salt (sodium) a day. If you have hypertension, you may need to reduce your sodium intake to 1,500 mg a day.  Limit alcohol intake to no more than 1 drink a day for nonpregnant women and 2 drinks a day for men. One drink equals 12 oz of beer, 5 oz of wine, or 1 oz of hard liquor.  Work with your health care provider to maintain a healthy body weight or to lose weight. Ask what an ideal weight is for you.  Get at least 30 minutes of exercise that causes your heart to beat faster (aerobic exercise) most days of the week. Activities may include walking, swimming, or biking.  Work with your health care provider or diet and nutrition specialist (dietitian) to adjust your eating plan to your individual calorie needs. Reading food labels  Check food labels for the amount of sodium per serving. Choose foods with less than 5 percent of the Daily Value of sodium. Generally, foods with less than 300 mg of sodium per serving fit into this eating plan.  To find whole grains, look for the word "whole" as the first word in the ingredient list. Shopping  Buy products labeled as "low-sodium" or "no salt added."  Buy fresh foods. Avoid canned foods and premade or frozen meals. Cooking  Avoid adding salt when cooking. Use salt-free seasonings or herbs instead of table salt or sea salt. Check with your health care provider or pharmacist before using salt substitutes.  Do not fry foods. Cook foods using healthy methods such as baking, boiling, grilling, and broiling instead.  Cook with  heart-healthy oils, such as olive, canola, soybean, or sunflower oil. Meal planning   Eat a balanced diet that includes: ? 5 or more servings of fruits and vegetables each day. At each meal, try to fill half of your plate with fruits and vegetables. ? Up to 6-8 servings of whole grains each day. ? Less than 6 oz of lean meat, poultry, or fish each day. A 3-oz serving of meat is about the same size as a deck of cards. One egg equals 1 oz. ? 2 servings of low-fat dairy each day. ? A serving of nuts, seeds, or beans 5 times each week. ? Heart-healthy fats. Healthy fats called Omega-3 fatty acids are found in foods such as flaxseeds and coldwater fish, like sardines, salmon, and mackerel.  Limit how much you eat of the following: ? Canned or prepackaged foods. ? Food that is high in trans fat, such as fried foods. ? Food that is high in saturated fat, such as fatty meat. ? Sweets, desserts, sugary drinks, and other foods with added sugar. ? Full-fat dairy products.  Do not salt foods before eating.  Try to eat at least 2 vegetarian meals each week.  Eat more home-cooked food and less restaurant, buffet, and fast food.  When eating at a restaurant, ask that your food be prepared with less salt or no salt, if possible. What foods are recommended? The items listed may not be a complete list. Talk with your dietitian about what   dietary choices are best for you. Grains Whole-grain or whole-wheat bread. Whole-grain or whole-wheat pasta. Brown rice. Oatmeal. Quinoa. Bulgur. Whole-grain and low-sodium cereals. Pita bread. Low-fat, low-sodium crackers. Whole-wheat flour tortillas. Vegetables Fresh or frozen vegetables (raw, steamed, roasted, or grilled). Low-sodium or reduced-sodium tomato and vegetable juice. Low-sodium or reduced-sodium tomato sauce and tomato paste. Low-sodium or reduced-sodium canned vegetables. Fruits All fresh, dried, or frozen fruit. Canned fruit in natural juice (without  added sugar). Meat and other protein foods Skinless chicken or turkey. Ground chicken or turkey. Pork with fat trimmed off. Fish and seafood. Egg whites. Dried beans, peas, or lentils. Unsalted nuts, nut butters, and seeds. Unsalted canned beans. Lean cuts of beef with fat trimmed off. Low-sodium, lean deli meat. Dairy Low-fat (1%) or fat-free (skim) milk. Fat-free, low-fat, or reduced-fat cheeses. Nonfat, low-sodium ricotta or cottage cheese. Low-fat or nonfat yogurt. Low-fat, low-sodium cheese. Fats and oils Soft margarine without trans fats. Vegetable oil. Low-fat, reduced-fat, or light mayonnaise and salad dressings (reduced-sodium). Canola, safflower, olive, soybean, and sunflower oils. Avocado. Seasoning and other foods Herbs. Spices. Seasoning mixes without salt. Unsalted popcorn and pretzels. Fat-free sweets. What foods are not recommended? The items listed may not be a complete list. Talk with your dietitian about what dietary choices are best for you. Grains Baked goods made with fat, such as croissants, muffins, or some breads. Dry pasta or rice meal packs. Vegetables Creamed or fried vegetables. Vegetables in a cheese sauce. Regular canned vegetables (not low-sodium or reduced-sodium). Regular canned tomato sauce and paste (not low-sodium or reduced-sodium). Regular tomato and vegetable juice (not low-sodium or reduced-sodium). Pickles. Olives. Fruits Canned fruit in a light or heavy syrup. Fried fruit. Fruit in cream or butter sauce. Meat and other protein foods Fatty cuts of meat. Ribs. Fried meat. Bacon. Sausage. Bologna and other processed lunch meats. Salami. Fatback. Hotdogs. Bratwurst. Salted nuts and seeds. Canned beans with added salt. Canned or smoked fish. Whole eggs or egg yolks. Chicken or turkey with skin. Dairy Whole or 2% milk, cream, and half-and-half. Whole or full-fat cream cheese. Whole-fat or sweetened yogurt. Full-fat cheese. Nondairy creamers. Whipped toppings.  Processed cheese and cheese spreads. Fats and oils Butter. Stick margarine. Lard. Shortening. Ghee. Bacon fat. Tropical oils, such as coconut, palm kernel, or palm oil. Seasoning and other foods Salted popcorn and pretzels. Onion salt, garlic salt, seasoned salt, table salt, and sea salt. Worcestershire sauce. Tartar sauce. Barbecue sauce. Teriyaki sauce. Soy sauce, including reduced-sodium. Steak sauce. Canned and packaged gravies. Fish sauce. Oyster sauce. Cocktail sauce. Horseradish that you find on the shelf. Ketchup. Mustard. Meat flavorings and tenderizers. Bouillon cubes. Hot sauce and Tabasco sauce. Premade or packaged marinades. Premade or packaged taco seasonings. Relishes. Regular salad dressings. Where to find more information:  National Heart, Lung, and Blood Institute: www.nhlbi.nih.gov  American Heart Association: www.heart.org Summary  The DASH eating plan is a healthy eating plan that has been shown to reduce high blood pressure (hypertension). It may also reduce your risk for type 2 diabetes, heart disease, and stroke.  With the DASH eating plan, you should limit salt (sodium) intake to 2,300 mg a day. If you have hypertension, you may need to reduce your sodium intake to 1,500 mg a day.  When on the DASH eating plan, aim to eat more fresh fruits and vegetables, whole grains, lean proteins, low-fat dairy, and heart-healthy fats.  Work with your health care provider or diet and nutrition specialist (dietitian) to adjust your eating plan to your individual   calorie needs. This information is not intended to replace advice given to you by your health care provider. Make sure you discuss any questions you have with your health care provider. Document Released: 06/26/2011 Document Revised: 06/30/2016 Document Reviewed: 06/30/2016 Elsevier Interactive Patient Education  2017 Elsevier Inc.  

## 2017-06-24 ENCOUNTER — Ambulatory Visit
Admission: EM | Admit: 2017-06-24 | Discharge: 2017-06-24 | Disposition: A | Discharge status: Home / Self Care | Payer: BLUE CROSS/BLUE SHIELD | Attending: Family Medicine | Admitting: Family Medicine

## 2017-06-24 ENCOUNTER — Other Ambulatory Visit: Payer: Self-pay

## 2017-06-24 ENCOUNTER — Encounter: Payer: Self-pay | Admitting: Emergency Medicine

## 2017-06-24 DIAGNOSIS — N390 Urinary tract infection, site not specified: Secondary | ICD-10-CM | POA: Diagnosis not present

## 2017-06-24 DIAGNOSIS — R300 Dysuria: Secondary | ICD-10-CM

## 2017-06-24 LAB — CHLAMYDIA/NGC RT PCR (ARMC ONLY)
Chlamydia Tr: NOT DETECTED
N gonorrhoeae: NOT DETECTED

## 2017-06-24 LAB — URINALYSIS, COMPLETE (UACMP) WITH MICROSCOPIC
Bilirubin Urine: NEGATIVE
GLUCOSE, UA: NEGATIVE mg/dL
HGB URINE DIPSTICK: NEGATIVE
Ketones, ur: NEGATIVE mg/dL
NITRITE: NEGATIVE
Protein, ur: NEGATIVE mg/dL
SPECIFIC GRAVITY, URINE: 1.02 (ref 1.005–1.030)
Squamous Epithelial / LPF: NONE SEEN
pH: 5.5 (ref 5.0–8.0)

## 2017-06-24 MED ORDER — LEVOFLOXACIN 500 MG PO TABS: 500.0000 mg | ORAL_TABLET | Freq: Every day | ORAL | 0 refills | Status: DC

## 2017-06-24 NOTE — ED Triage Notes (Signed)
Patient c/o burning when urinating that started 2 days ago and got worse last night.

## 2017-06-24 NOTE — ED Provider Notes (Signed)
[facility]    CSN: [id number] Arrival date & time: 06/24/17  0845     History   Chief Complaint Chief Complaint  Patient presents with  . Dysuria    HPI Andrew Hodge is a 56 y.o. male.   The history is provided by the patient.  Dysuria  This is a new problem. The current episode started 2 days ago. The problem occurs constantly. The problem has been gradually worsening. Pertinent negatives include no chest pain, no abdominal pain, no headaches and no shortness of breath. Relieved by: nothing tried.    Past Medical History:  Diagnosis Date  . Hypertension   . Impotence     Patient Active Problem List   Diagnosis Date Noted  . Hypertension 07/08/2016  . Obesity 06/26/2016    History reviewed. No pertinent surgical history.     Home Medications    Prior to Admission medications   Medication Sig Start Date End Date Taking? Authorizing Provider  amLODipine (NORVASC) 5 MG tablet Take 1 tablet (5 mg total) by mouth daily. 04/16/17  Yes Glean Hess, MD  irbesartan (AVAPRO) 150 MG tablet Take 1 tablet (150 mg total) by mouth daily. 01/23/17  Yes Glean Hess, MD  levofloxacin (LEVAQUIN) 500 MG tablet Take 1 tablet (500 mg total) by mouth daily. 06/24/17   Norval Gable, MD  sildenafil (VIAGRA) 100 MG tablet Take 0.5-1 tablets (50-100 mg total) by mouth daily as needed for erectile dysfunction. 09/01/16   Glean Hess, MD    Family History Family History  Problem Relation Age of Onset  . Diabetes Father     Social History Social History   Tobacco Use  . Smoking status: Never Smoker  . Smokeless tobacco: Never Used  Substance Use Topics  . Alcohol use: Yes  . Drug use: No     Allergies   Patient has no known allergies.   Review of Systems Review of Systems  Respiratory: Negative for shortness of breath.   Cardiovascular: Negative for chest pain.  Gastrointestinal: Negative for abdominal pain.  Genitourinary: Positive for  dysuria.  Neurological: Negative for headaches.     Physical Exam Triage Vital Signs ED Triage Vitals  Enc Vitals Group     BP 06/24/17 0903 (S) (!) 168/97     Pulse Rate 06/24/17 0903 70     Resp 06/24/17 0903 17     Temp 06/24/17 0903 98.4 F (36.9 C)     Temp Source 06/24/17 0903 Oral     SpO2 06/24/17 0903 97 %     Weight 06/24/17 0901 279 lb (126.6 kg)     Height 06/24/17 0901 5\' 11"  (1.803 m)     Head Circumference --      Peak Flow --      Pain Score 06/24/17 0901 9     Pain Loc --      Pain Edu? --      Excl. in [place]? --    No data found.  Updated Vital Signs BP (S) (!) 168/97 (BP Location: Right Arm)   Pulse 70   Temp 98.4 F (36.9 C) (Oral)   Resp 17   Ht 5\' 11"  (1.803 m)   Wt 279 lb (126.6 kg)   SpO2 97%   BMI 38.91 kg/m   Visual Acuity Right Eye Distance:   Left Eye Distance:   Bilateral Distance:    Right Eye Near:   Left Eye Near:    Bilateral Near:  Physical Exam  Constitutional: He appears well-developed and well-nourished. No distress.  Abdominal: Soft. He exhibits no distension and no mass. There is no tenderness. There is no rebound and no guarding.  Genitourinary: Testes normal and penis normal. No discharge found.  Skin: He is not diaphoretic.  Nursing note and vitals reviewed.    UC Treatments / Results  Labs (all labs ordered are listed, but only abnormal results are displayed) Labs Reviewed  URINALYSIS, COMPLETE (UACMP) WITH MICROSCOPIC - Abnormal; Notable for the following components:      Result Value   APPearance HAZY (*)    Leukocytes, UA SMALL (*)    Bacteria, UA RARE (*)    All other components within normal limits  CHLAMYDIA/NGC RT PCR ([facility] ONLY)  URINE CULTURE    EKG  EKG Interpretation None       Radiology No results found.  Procedures Procedures (including critical care time)  Medications Ordered in UC Medications - No data to display   Initial Impression / Assessment and Plan / UC  Course  I have reviewed the triage vital signs and the nursing notes.  Pertinent labs & imaging results that were available during my care of the patient were reviewed by me and considered in my medical decision making (see chart for details).       Final Clinical Impressions(s) / UC Diagnoses   Final diagnoses:  Dysuria  Urinary tract infection without hematuria, site unspecified    ED Discharge Orders        Ordered    levofloxacin (LEVAQUIN) 500 MG tablet  Daily     06/24/17 0943     1. Lab results and diagnosis reviewed with patient 2. rx as per orders above; reviewed possible side effects, interactions, risks and benefits  3. Recommend supportive treatment with increased water intake 4. Check urine culture 5. Check urine GC/chlamydia 6. Follow-up prn if symptoms worsen or don't improve  Controlled Substance Prescriptions [place] Controlled Substance Registry consulted? Not Applicable   Norval Gable, MD 06/24/17 1131

## 2017-06-25 LAB — URINE CULTURE
Culture: NO GROWTH
SPECIAL REQUESTS: NORMAL

## 2017-06-30 ENCOUNTER — Encounter: Payer: Self-pay | Admitting: *Deleted

## 2017-06-30 ENCOUNTER — Other Ambulatory Visit: Payer: Self-pay

## 2017-06-30 ENCOUNTER — Ambulatory Visit
Admission: EM | Admit: 2017-06-30 | Discharge: 2017-06-30 | Disposition: A | Discharge status: Home / Self Care | Payer: BLUE CROSS/BLUE SHIELD | Attending: Family Medicine | Admitting: Family Medicine

## 2017-06-30 DIAGNOSIS — H5789 Other specified disorders of eye and adnexa: Secondary | ICD-10-CM

## 2017-06-30 MED ORDER — POLYMYXIN B-TRIMETHOPRIM 10000-0.1 UNIT/ML-% OP SOLN: 1.0000 [drp] | Freq: Four times a day (QID) | OPHTHALMIC | 0 refills | Status: AC

## 2017-06-30 MED ORDER — OLOPATADINE HCL 0.2 % OP SOLN: OPHTHALMIC | 0 refills | Status: DC

## 2017-06-30 NOTE — ED Provider Notes (Signed)
[facility]    CSN: [id number] Arrival date & time: 06/30/17  1326  History   Chief Complaint Chief Complaint  Patient presents with  . Eye Problem   HPI  56 year old male presents with eye redness.   Patient reports that this started over the weekend.  No known inciting event.  No reports of foreign body exposure.  He states that it affects both eyes.  Associated itching.  No pain.  No photophobia.  Reports mild crusting this a.m.  No vision changes.  No known exacerbating factors.  He has been using red eyes without improvement.  No other associated symptoms.  No other complaints at this time.  Past Medical History:  Diagnosis Date  . Hypertension   . Impotence    Patient Active Problem List   Diagnosis Date Noted  . Hypertension 07/08/2016  . Obesity 06/26/2016   History reviewed. No pertinent surgical history.  Home Medications    Prior to Admission medications   Medication Sig Start Date End Date Taking? Authorizing Provider  amLODipine (NORVASC) 5 MG tablet Take 1 tablet (5 mg total) by mouth daily. 04/16/17  Yes Glean Hess, MD  irbesartan (AVAPRO) 150 MG tablet Take 1 tablet (150 mg total) by mouth daily. 01/23/17  Yes Glean Hess, MD  Olopatadine HCl 0.2 % SOLN 1 drop to each eye daily. 06/30/17   Coral Spikes, DO  sildenafil (VIAGRA) 100 MG tablet Take 0.5-1 tablets (50-100 mg total) by mouth daily as needed for erectile dysfunction. 09/01/16   Glean Hess, MD  trimethoprim-polymyxin b Mayra Neer) ophthalmic solution Place 1 drop into both eyes every 6 (six) hours for 5 days. 06/30/17 07/05/17  Coral Spikes, DO    Family History Family History  Problem Relation Age of Onset  . Diabetes Father     Social History Social History   Tobacco Use  . Smoking status: Never Smoker  . Smokeless tobacco: Never Used  Substance Use Topics  . Alcohol use: Yes  . Drug use: No     Allergies   Patient has no known allergies.   Review  of Systems Review of Systems  Constitutional: Negative.   Eyes: Positive for redness and itching. Negative for photophobia, pain and visual disturbance.   Physical Exam Triage Vital Signs ED Triage Vitals  Enc Vitals Group     BP 06/30/17 1500 (!) 168/93     Pulse Rate 06/30/17 1500 (!) 55     Resp 06/30/17 1500 16     Temp 06/30/17 1500 (!) 97.5 F (36.4 C)     Temp Source 06/30/17 1500 Oral     SpO2 06/30/17 1500 100 %     Weight --      Height --      Head Circumference --      Peak Flow --      Pain Score 06/30/17 1502 0     Pain Loc --      Pain Edu? --      Excl. in [place]? --    No data found.  Updated Vital Signs BP (!) 168/93 (BP Location: Left Arm)   Pulse (!) 55   Temp (!) 97.5 F (36.4 C) (Oral)   Resp 16   SpO2 100%   Visual Acuity Right Eye Distance: 20/25 Left Eye Distance: 20/30 Bilateral Distance: 20/30  Right Eye Near:   Left Eye Near:    Bilateral Near:     Physical Exam  Constitutional: He  is oriented to person, place, and time. He appears well-developed and well-nourished. No distress.  HENT:  Head: Normocephalic and atraumatic.  Eyes: EOM are normal. Pupils are equal, round, and reactive to light.  Bilateral conjunctival injection.  No photophobia.  No drainage.  Cardiovascular: Normal rate and regular rhythm.  No murmur heard. Pulmonary/Chest: Effort normal and breath sounds normal. No respiratory distress. He has no wheezes. He has no rales.  Neurological: He is alert and oriented to person, place, and time.  Skin: Skin is warm.  Psychiatric: He has a normal mood and affect.  Vitals reviewed.  UC Treatments / Results  Labs (all labs ordered are listed, but only abnormal results are displayed) Labs Reviewed - No data to display  EKG  EKG Interpretation None       Radiology No results found.  Procedures Procedures (including critical care time)  Medications Ordered in UC Medications - No data to display   Initial  Impression / Assessment and Plan / UC Course  I have reviewed the triage vital signs and the nursing notes.  Pertinent labs & imaging results that were available during my care of the patient were reviewed by me and considered in my medical decision making (see chart for details).     56 year old male presents with bilateral red eyes. No clinical evidence of iritis, scleritis, acute angle closure glaucoma. Treating empirically with pataday and Polytrim.  I advised him that he needs to see an eye doctor urgently if he fails to improve or worsens. Final Clinical Impressions(s) / UC Diagnoses   Final diagnoses:  Red eyes    ED Discharge Orders        Ordered    trimethoprim-polymyxin b (POLYTRIM) ophthalmic solution  Every 6 hours     06/30/17 1559    Olopatadine HCl 0.2 % SOLN     06/30/17 1559     Controlled Substance Prescriptions [place] Controlled Substance Registry consulted? Not Applicable   Coral Spikes, DO 06/30/17 1647

## 2017-06-30 NOTE — Discharge Instructions (Signed)
Eye drops as prescribed.  If you worsen, or fail to improve see an eye doctor immediately.  Take care  Dr. Lacinda Axon

## 2017-06-30 NOTE — ED Triage Notes (Signed)
Patient started having symptom of bilateral eye irritation. Both eyes are visibly red.

## 2017-09-14 ENCOUNTER — Encounter: Payer: Self-pay | Admitting: General Surgery

## 2017-10-22 ENCOUNTER — Encounter: Payer: Self-pay | Admitting: Internal Medicine

## 2017-10-22 ENCOUNTER — Ambulatory Visit (INDEPENDENT_AMBULATORY_CARE_PROVIDER_SITE_OTHER): Payer: BLUE CROSS/BLUE SHIELD | Admitting: Internal Medicine

## 2017-10-22 VITALS — BP 146/102 | HR 61 | Ht 71.0 in | Wt 278.0 lb

## 2017-10-22 DIAGNOSIS — Z1211 Encounter for screening for malignant neoplasm of colon: Secondary | ICD-10-CM | POA: Diagnosis not present

## 2017-10-22 DIAGNOSIS — Z0000 Encounter for general adult medical examination without abnormal findings: Secondary | ICD-10-CM | POA: Diagnosis not present

## 2017-10-22 DIAGNOSIS — Z125 Encounter for screening for malignant neoplasm of prostate: Secondary | ICD-10-CM | POA: Diagnosis not present

## 2017-10-22 DIAGNOSIS — Z1159 Encounter for screening for other viral diseases: Secondary | ICD-10-CM | POA: Diagnosis not present

## 2017-10-22 DIAGNOSIS — I10 Essential (primary) hypertension: Secondary | ICD-10-CM

## 2017-10-22 LAB — POCT URINALYSIS DIPSTICK
Bilirubin, UA: NEGATIVE
Blood, UA: NEGATIVE
Glucose, UA: NEGATIVE
KETONES UA: NEGATIVE
Leukocytes, UA: NEGATIVE
Nitrite, UA: NEGATIVE
PH UA: 7 (ref 5.0–8.0)
Protein, UA: NEGATIVE
Spec Grav, UA: 1.015 (ref 1.010–1.025)
UROBILINOGEN UA: 0.2 U/dL

## 2017-10-22 MED ORDER — IRBESARTAN 150 MG PO TABS: 150.0000 mg | ORAL_TABLET | Freq: Every day | ORAL | 5 refills | Status: DC

## 2017-10-22 MED ORDER — AMLODIPINE BESYLATE 10 MG PO TABS: 10.0000 mg | ORAL_TABLET | Freq: Every day | ORAL | 5 refills | Status: DC

## 2017-10-22 NOTE — Progress Notes (Signed)
Date:  10/22/2017   Name:  Andrew Hodge   DOB:  1961-03-23   MRN:  [id number]   Chief Complaint: Hypertension and Annual Exam Andrew Hodge is a 57 y.o. male who presents today for his Complete Annual Exam. He feels well. He reports exercising less than previously. He reports he is sleeping well. He has never had a colonoscopy.  Hypertension  This is a chronic problem. The problem has been waxing and waning since onset. The problem is resistant. Pertinent negatives include no blurred vision, chest pain, headaches, orthopnea, palpitations, peripheral edema or shortness of breath. Past treatments include calcium channel blockers and angiotensin blockers. The current treatment provides moderate improvement. There are no compliance problems.       Review of Systems  Constitutional: Negative for appetite change, chills, diaphoresis, fatigue and unexpected weight change.  HENT: Negative for hearing loss, tinnitus, trouble swallowing and voice change.   Eyes: Negative for blurred vision and visual disturbance.  Respiratory: Negative for choking, shortness of breath and wheezing.   Cardiovascular: Negative for chest pain, palpitations, orthopnea and leg swelling.  Gastrointestinal: Negative for abdominal pain, blood in stool, constipation and diarrhea.  Genitourinary: Negative for difficulty urinating, dysuria and frequency.  Musculoskeletal: Negative for arthralgias, back pain and myalgias.  Skin: Negative for color change and rash.  Neurological: Negative for dizziness, syncope and headaches.  Hematological: Negative for adenopathy.  Psychiatric/Behavioral: Negative for dysphoric mood and sleep disturbance.    Patient Active Problem List   Diagnosis Date Noted  . Hypertension 07/08/2016  . Obesity 06/26/2016    Prior to Admission medications   Medication Sig Start Date End Date Taking? Authorizing Provider  amLODipine (NORVASC) 5 MG tablet Take 1 tablet (5 mg total) by mouth  daily. 04/16/17  Yes Glean Hess, MD  irbesartan (AVAPRO) 150 MG tablet Take 1 tablet (150 mg total) by mouth daily. 01/23/17  Yes Glean Hess, MD  Olopatadine HCl 0.2 % SOLN 1 drop to each eye daily. 06/30/17  Yes Cook, Jayce G, DO  sildenafil (VIAGRA) 100 MG tablet Take 0.5-1 tablets (50-100 mg total) by mouth daily as needed for erectile dysfunction. 09/01/16  Yes Glean Hess, MD    No Known Allergies  History reviewed. No pertinent surgical history.  Social History   Tobacco Use  . Smoking status: Never Smoker  . Smokeless tobacco: Never Used  Substance Use Topics  . Alcohol use: Yes  . Drug use: No     Medication list has been reviewed and updated.  PHQ 2/9 Scores 10/22/2017 07/01/2016  PHQ - 2 Score 0 0  PHQ- 9 Score 0 -    Physical Exam  Constitutional: He is oriented to person, place, and time. He appears well-developed and well-nourished.  HENT:  Head: Normocephalic.  Right Ear: Tympanic membrane, external ear and ear canal normal.  Left Ear: Tympanic membrane, external ear and ear canal normal.  Nose: Nose normal.  Mouth/Throat: Uvula is midline and oropharynx is clear and moist.  Eyes: Pupils are equal, round, and reactive to light. Conjunctivae and EOM are normal.  Neck: Normal range of motion. Neck supple. Carotid bruit is not present. No thyromegaly present.  Cardiovascular: Normal rate, regular rhythm, normal heart sounds and intact distal pulses.  Pulmonary/Chest: Effort normal and breath sounds normal. He has no wheezes. Right breast exhibits no mass. Left breast exhibits no mass.  Abdominal: Soft. Normal appearance and bowel sounds are normal. There is no hepatosplenomegaly. There is  no tenderness.  Musculoskeletal: Normal range of motion.  Lymphadenopathy:    He has no cervical adenopathy.  Neurological: He is alert and oriented to person, place, and time. He has normal reflexes.  Skin: Skin is warm, dry and intact.  Psychiatric: He has a  normal mood and affect. His speech is normal and behavior is normal. Judgment and thought content normal.  Nursing note and vitals reviewed.   BP (!) 146/102   Pulse 61   Ht 5\' 11"  (1.803 m)   Wt 278 lb (126.1 kg)   SpO2 97%   BMI 38.77 kg/m   Assessment and Plan: 1. Annual physical exam Resume regular exercise - Lipid panel - POCT urinalysis dipstick  2. Essential hypertension Not controlled Will increase amlodipine - amLODipine (NORVASC) 10 MG tablet; Take 1 tablet (10 mg total) by mouth daily.  Dispense: 30 tablet; Refill: 5 - irbesartan (AVAPRO) 150 MG tablet; Take 1 tablet (150 mg total) by mouth daily.  Dispense: 30 tablet; Refill: 5 - CBC with Differential/Platelet - Comprehensive metabolic panel  3. Colon cancer screening - Ambulatory referral to Gastroenterology  4. Prostate cancer screening DRE deferred - PSA  5. Need for hepatitis C screening test - Hepatitis C antibody   Meds ordered this encounter  Medications  . amLODipine (NORVASC) 10 MG tablet    Sig: Take 1 tablet (10 mg total) by mouth daily.    Dispense:  30 tablet    Refill:  5  . irbesartan (AVAPRO) 150 MG tablet    Sig: Take 1 tablet (150 mg total) by mouth daily.    Dispense:  30 tablet    Refill:  5    Partially dictated using Editor, commissioning. Any errors are unintentional.  Halina Maidens, MD [facility]  10/22/2017

## 2017-10-23 LAB — CBC WITH DIFFERENTIAL/PLATELET
BASOS ABS: 0 10*3/uL (ref 0.0–0.2)
Basos: 0 %
EOS (ABSOLUTE): 0.1 10*3/uL (ref 0.0–0.4)
Eos: 2 %
Hematocrit: 43.1 % (ref 37.5–51.0)
Hemoglobin: 14.7 g/dL (ref 13.0–17.7)
Immature Grans (Abs): 0 10*3/uL (ref 0.0–0.1)
Immature Granulocytes: 1 %
LYMPHS ABS: 1.3 10*3/uL (ref 0.7–3.1)
Lymphs: 35 %
MCH: 29.8 pg (ref 26.6–33.0)
MCHC: 34.1 g/dL (ref 31.5–35.7)
MCV: 87 fL (ref 79–97)
MONOCYTES: 13 %
MONOS ABS: 0.5 10*3/uL (ref 0.1–0.9)
NEUTROS ABS: 1.7 10*3/uL (ref 1.4–7.0)
Neutrophils: 49 %
Platelets: 203 10*3/uL (ref 150–379)
RBC: 4.93 x10E6/uL (ref 4.14–5.80)
RDW: 13.2 % (ref 12.3–15.4)
WBC: 3.6 10*3/uL (ref 3.4–10.8)

## 2017-10-23 LAB — COMPREHENSIVE METABOLIC PANEL
ALBUMIN: 4.3 g/dL (ref 3.5–5.5)
ALT: 42 IU/L (ref 0–44)
AST: 29 IU/L (ref 0–40)
Albumin/Globulin Ratio: 1.5 (ref 1.2–2.2)
Alkaline Phosphatase: 100 IU/L (ref 39–117)
BILIRUBIN TOTAL: 0.5 mg/dL (ref 0.0–1.2)
BUN / CREAT RATIO: 17 (ref 9–20)
BUN: 13 mg/dL (ref 6–24)
CHLORIDE: 105 mmol/L (ref 96–106)
CO2: 22 mmol/L (ref 20–29)
CREATININE: 0.77 mg/dL (ref 0.76–1.27)
Calcium: 9.5 mg/dL (ref 8.7–10.2)
GFR calc Af Amer: 117 mL/min/{1.73_m2} (ref >59)
GFR calc non Af Amer: 101 mL/min/{1.73_m2} (ref >59)
GLUCOSE: 81 mg/dL (ref 65–99)
Globulin, Total: 2.8 g/dL (ref 1.5–4.5)
Potassium: 4.3 mmol/L (ref 3.5–5.2)
Sodium: 140 mmol/L (ref 134–144)
Total Protein: 7.1 g/dL (ref 6.0–8.5)

## 2017-10-23 LAB — LIPID PANEL
CHOL/HDL RATIO: 3.5 ratio (ref 0.0–5.0)
Cholesterol, Total: 201 mg/dL — ABNORMAL HIGH (ref 100–199)
HDL: 58 mg/dL (ref >39)
LDL Calculated: 117 mg/dL — ABNORMAL HIGH (ref 0–99)
Triglycerides: 130 mg/dL (ref 0–149)
VLDL Cholesterol Cal: 26 mg/dL (ref 5–40)

## 2017-10-23 LAB — HEPATITIS C ANTIBODY

## 2017-10-23 LAB — PSA: PROSTATE SPECIFIC AG, SERUM: 0.3 ng/mL (ref 0.0–4.0)

## 2017-10-23 NOTE — Progress Notes (Signed)
Patient informed and agrees to see specialist.

## 2017-10-24 ENCOUNTER — Other Ambulatory Visit: Payer: Self-pay | Admitting: Internal Medicine

## 2017-10-24 DIAGNOSIS — B182 Chronic viral hepatitis C: Secondary | ICD-10-CM | POA: Insufficient documentation

## 2017-10-24 DIAGNOSIS — R768 Other specified abnormal immunological findings in serum: Secondary | ICD-10-CM

## 2017-12-07 ENCOUNTER — Other Ambulatory Visit: Payer: Self-pay

## 2017-12-07 ENCOUNTER — Other Ambulatory Visit
Admission: RE | Admit: 2017-12-07 | Discharge: 2017-12-07 | Disposition: A | Discharge status: Home / Self Care | Payer: BLUE CROSS/BLUE SHIELD | Source: Ambulatory Visit | Attending: Gastroenterology | Admitting: Gastroenterology

## 2017-12-07 ENCOUNTER — Encounter: Payer: Self-pay | Admitting: Gastroenterology

## 2017-12-07 ENCOUNTER — Ambulatory Visit (INDEPENDENT_AMBULATORY_CARE_PROVIDER_SITE_OTHER): Payer: BLUE CROSS/BLUE SHIELD | Admitting: Gastroenterology

## 2017-12-07 ENCOUNTER — Encounter (INDEPENDENT_AMBULATORY_CARE_PROVIDER_SITE_OTHER): Payer: Self-pay

## 2017-12-07 VITALS — BP 166/93 | HR 91 | Ht 71.0 in | Wt 282.5 lb

## 2017-12-07 DIAGNOSIS — R768 Other specified abnormal immunological findings in serum: Secondary | ICD-10-CM | POA: Insufficient documentation

## 2017-12-07 NOTE — Progress Notes (Signed)
Gastroenterology Consultation  Referring Provider:     Glean Hess, MD Primary Care Physician:  Glean Hess, MD Primary Gastroenterologist:  Dr. Allen Norris     Reason for Consultation:     Positive hepatitis C antibody        HPI:   Andrew Hodge is a 57 y.o. y/o male referred for consultation & management of positive hepatitis C antibody by Dr. Army Melia, Jesse Sans, MD.  The patient comes in today after being found to have a blood test showing him to have a positive antibody to hepatitis C.  The patient's liver enzymes have been historically normal dating back to December 2017.  The patient denies any history of IV drug use tattoos or other high risk activity.  He also denies knowing of any blood transfusions before 1990.  The patient thinks that he was seen in the past for hepatitis C many years ago and reports that he was given injections to get rid of the hepatitis C.  He states that he was under the impression that the hepatitis C was gone.  He denies any abdominal pain black stools but history is nausea vomiting fevers or chills.   Past Medical History:  Diagnosis Date  . Hypertension   . Impotence     No past surgical history on file.  Prior to Admission medications   Medication Sig Start Date End Date Taking? Authorizing Provider  amLODipine (NORVASC) 10 MG tablet Take 1 tablet (10 mg total) by mouth daily. 10/22/17   Glean Hess, MD  irbesartan (AVAPRO) 150 MG tablet Take 1 tablet (150 mg total) by mouth daily. 10/22/17   Glean Hess, MD  Olopatadine HCl 0.2 % SOLN 1 drop to each eye daily. 06/30/17   Coral Spikes, DO  sildenafil (VIAGRA) 100 MG tablet Take 0.5-1 tablets (50-100 mg total) by mouth daily as needed for erectile dysfunction. 09/01/16   Glean Hess, MD    Family History  Problem Relation Age of Onset  . Diabetes Father      Social History   Tobacco Use  . Smoking status: Never Smoker  . Smokeless tobacco: Never Used  Substance Use  Topics  . Alcohol use: Yes  . Drug use: No    Allergies as of 12/07/2017  . (No Known Allergies)    Review of Systems:    All systems reviewed and negative except where noted in HPI.   Physical Exam:  There were no vitals taken for this visit. No LMP for male patient. Psych:  Alert and cooperative. Normal mood and affect. General:   Alert,  Well-developed, well-nourished, pleasant and cooperative in NAD Head:  Normocephalic and atraumatic. Eyes:  Sclera clear, no icterus.   Conjunctiva pink. Ears:  Normal auditory acuity. Nose:  No deformity, discharge, or lesions. Mouth:  No deformity or lesions,oropharynx pink & moist. Neck:  Supple; no masses or thyromegaly. Lungs:  Respirations even and unlabored.  Clear throughout to auscultation.   No wheezes, crackles, or rhonchi. No acute distress. Heart:  Regular rate and rhythm; no murmurs, clicks, rubs, or gallops. Abdomen:  Normal bowel sounds.  No bruits.  Soft, non-tender and non-distended without masses, hepatosplenomegaly or hernias noted.  No guarding or rebound tenderness.  Negative Carnett sign.   Rectal:  Deferred.  Msk:  Symmetrical without gross deformities.  Good, equal movement & strength bilaterally. Pulses:  Normal pulses noted. Extremities:  No clubbing or edema.  No cyanosis. Neurologic:  Alert  and oriented x3;  grossly normal neurologically. Skin:  Intact without significant lesions or rashes.  No jaundice. Lymph Nodes:  No significant cervical adenopathy. Psych:  Alert and cooperative. Normal mood and affect.  Imaging Studies: No results found.  Assessment and Plan:   Andrew Hodge is a 57 y.o. y/o male Who comes in with a positive antibody for hepatitis C.  The patient thinks he may have been treated many years ago with injections for his hepatitis C. The patient will have his viral load checked and will also be set up for a screening colonoscopy since the patient has never had a colonoscopy in the past. I  have discussed risks & benefits which include, but are not limited to, bleeding, infection, perforation & drug reaction.  The patient agrees with this plan & written consent will be obtained.     Lucilla Lame, MD. Marval Regal   Note: This dictation was prepared with Dragon dictation along with smaller phrase technology. Any transcriptional errors that result from this process are unintentional.

## 2017-12-08 ENCOUNTER — Other Ambulatory Visit: Payer: Self-pay

## 2017-12-08 DIAGNOSIS — Z1211 Encounter for screening for malignant neoplasm of colon: Secondary | ICD-10-CM

## 2017-12-08 LAB — HCV RNA QUANT
HCV QUANT LOG: 6.92 {Log_IU}/mL (ref >1.70)
HCV Quantitative: 8320000 IU/mL (ref >50)

## 2017-12-09 ENCOUNTER — Other Ambulatory Visit: Payer: Self-pay

## 2017-12-09 ENCOUNTER — Telehealth: Payer: Self-pay

## 2017-12-09 DIAGNOSIS — R768 Other specified abnormal immunological findings in serum: Secondary | ICD-10-CM

## 2017-12-09 NOTE — Telephone Encounter (Signed)
-----   Message from Lucilla Lame, MD sent at 12/08/2017  7:08 PM EDT ----- This patient will need to be started on treatment when all of his labs are back.

## 2017-12-09 NOTE — Telephone Encounter (Signed)
LVM for pt to return my call.

## 2017-12-09 NOTE — Telephone Encounter (Signed)
Pt returned call and has been scheduled for a RUQ abdominal US and labs at [facility] on Tuesday, May 28th at 9:30am. Pt has been instructed to arrive at the [facility] registration desk at 9:15am and to be NPO after midnight. Pt verbalized understanding of these instructions.

## 2017-12-15 ENCOUNTER — Ambulatory Visit
Admission: RE | Admit: 2017-12-15 | Discharge: 2017-12-15 | Disposition: A | Discharge status: Home / Self Care | Payer: BLUE CROSS/BLUE SHIELD | Source: Ambulatory Visit | Attending: Gastroenterology | Admitting: Gastroenterology

## 2017-12-15 DIAGNOSIS — Z9049 Acquired absence of other specified parts of digestive tract: Secondary | ICD-10-CM | POA: Diagnosis not present

## 2017-12-15 DIAGNOSIS — K760 Fatty (change of) liver, not elsewhere classified: Secondary | ICD-10-CM | POA: Diagnosis not present

## 2017-12-15 DIAGNOSIS — R768 Other specified abnormal immunological findings in serum: Secondary | ICD-10-CM

## 2017-12-17 ENCOUNTER — Telehealth: Payer: Self-pay

## 2017-12-17 NOTE — Telephone Encounter (Signed)
-----   Message from Lucilla Lame, MD sent at 12/16/2017  9:43 PM EDT ----- Let the patient know that the scan showed moderate fibrosis and should be started on treatment when all the labs are back

## 2017-12-17 NOTE — Telephone Encounter (Signed)
LVM for pt to return my call. Advised pt on the voicemail he will need to get his labs completed before I can start him on treatment.

## 2017-12-18 ENCOUNTER — Telehealth: Payer: Self-pay

## 2017-12-18 ENCOUNTER — Other Ambulatory Visit
Admission: RE | Admit: 2017-12-18 | Discharge: 2017-12-18 | Disposition: A | Discharge status: Home / Self Care | Payer: BLUE CROSS/BLUE SHIELD | Source: Ambulatory Visit | Attending: Gastroenterology | Admitting: Gastroenterology

## 2017-12-18 DIAGNOSIS — R768 Other specified abnormal immunological findings in serum: Secondary | ICD-10-CM | POA: Diagnosis present

## 2017-12-18 LAB — HEPATIC FUNCTION PANEL
ALBUMIN: 4.1 g/dL (ref 3.5–5.0)
ALK PHOS: 94 U/L (ref 38–126)
ALT: 44 U/L (ref 17–63)
AST: 26 U/L (ref 15–41)
Bilirubin, Direct: <0.1 mg/dL — ABNORMAL LOW (ref 0.1–0.5)
TOTAL PROTEIN: 7.5 g/dL (ref 6.5–8.1)
Total Bilirubin: 0.6 mg/dL (ref 0.3–1.2)

## 2017-12-18 NOTE — Telephone Encounter (Signed)
Patient requested to reschedule colonoscopy from 06/10 to August 29th.  LVM with Maudie Mercury and Debbie at [facility] to change date.  Thanks [employer]

## 2017-12-18 NOTE — Telephone Encounter (Signed)
Pt left vm to reschedule apt

## 2017-12-19 LAB — CERULOPLASMIN: Ceruloplasmin: 31.8 mg/dL — ABNORMAL HIGH (ref 16.0–31.0)

## 2017-12-19 LAB — HEPATITIS B SURFACE ANTIBODY,QUALITATIVE: Hep B S Ab: REACTIVE

## 2017-12-19 LAB — ALPHA-1-ANTITRYPSIN: A1 ANTITRYPSIN SER: 126 mg/dL (ref 90–200)

## 2017-12-19 LAB — ANA: ANA: NEGATIVE

## 2017-12-19 LAB — HEPATITIS A ANTIBODY, TOTAL: Hep A Total Ab: NEGATIVE

## 2017-12-19 LAB — HEPATITIS B SURFACE ANTIGEN: HEP B S AG: NEGATIVE

## 2017-12-20 LAB — MITOCHONDRIAL ANTIBODIES: Mitochondrial M2 Ab, IgG: <20 Units (ref 0.0–20.0)

## 2017-12-20 LAB — ANTI-SMOOTH MUSCLE ANTIBODY, IGG: F-ACTIN AB IGG: 13 U (ref 0–19)

## 2017-12-21 ENCOUNTER — Ambulatory Visit: Payer: BLUE CROSS/BLUE SHIELD | Admitting: Gastroenterology

## 2017-12-22 LAB — HEPATITIS C GENOTYPE

## 2017-12-22 NOTE — Telephone Encounter (Signed)
-----   Message from Lucilla Lame, MD sent at 12/16/2017  9:43 PM EDT ----- Let the patient know that the scan showed moderate fibrosis and should be started on treatment when all the labs are back

## 2017-12-22 NOTE — Telephone Encounter (Signed)
Pt notified of [place] results. Waiting on additional labs to be resulted.

## 2017-12-23 ENCOUNTER — Encounter: Payer: Self-pay | Admitting: Internal Medicine

## 2017-12-23 ENCOUNTER — Ambulatory Visit: Payer: BLUE CROSS/BLUE SHIELD | Admitting: Internal Medicine

## 2017-12-23 DIAGNOSIS — I10 Essential (primary) hypertension: Secondary | ICD-10-CM

## 2017-12-23 NOTE — Progress Notes (Signed)
Date:  12/23/2017   Name:  Andrew Hodge   DOB:  07-May-1961   MRN:  [id number]   Chief Complaint: Hypertension Hypertension  This is a chronic problem. The problem has been gradually improving since onset. Pertinent negatives include no chest pain, headaches, palpitations or shortness of breath. Past treatments include calcium channel blockers and angiotensin blockers. The current treatment provides significant improvement. There are no compliance problems (but he did have some dizziness in the past on higher dose irbesartan).    BP Readings from Last 3 Encounters:  12/23/17 (!) 142/100  12/07/17 (!) 166/93  10/22/17 (!) 146/102      Review of Systems  Constitutional: Negative for chills, fatigue and fever.  Respiratory: Negative for chest tightness, shortness of breath and wheezing.   Cardiovascular: Negative for chest pain, palpitations and leg swelling.  Neurological: Negative for dizziness and headaches.  Hematological: Negative for adenopathy.    Patient Active Problem List   Diagnosis Date Noted  . Positive hepatitis C antibody test 10/24/2017  . Hypertension 07/08/2016  . Obesity 06/26/2016    Prior to Admission medications   Medication Sig Start Date End Date Taking? Authorizing Provider  amLODipine (NORVASC) 10 MG tablet Take 1 tablet (10 mg total) by mouth daily. 10/22/17  Yes Glean Hess, MD  irbesartan (AVAPRO) 150 MG tablet Take 1 tablet (150 mg total) by mouth daily. 10/22/17  Yes Glean Hess, MD  Olopatadine HCl 0.2 % SOLN 1 drop to each eye daily. 06/30/17  Yes Cook, Jayce G, DO  sildenafil (VIAGRA) 100 MG tablet Take 0.5-1 tablets (50-100 mg total) by mouth daily as needed for erectile dysfunction. 09/01/16  Yes Glean Hess, MD    No Known Allergies  History reviewed. No pertinent surgical history.  Social History   Tobacco Use  . Smoking status: Never Smoker  . Smokeless tobacco: Never Used  Substance Use Topics  . Alcohol use: Yes   . Drug use: No     Medication list has been reviewed and updated.  Current Meds  Medication Sig  . amLODipine (NORVASC) 10 MG tablet Take 1 tablet (10 mg total) by mouth daily.  . irbesartan (AVAPRO) 150 MG tablet Take 1 tablet (150 mg total) by mouth daily.  . Olopatadine HCl 0.2 % SOLN 1 drop to each eye daily.  . sildenafil (VIAGRA) 100 MG tablet Take 0.5-1 tablets (50-100 mg total) by mouth daily as needed for erectile dysfunction.    PHQ 2/9 Scores 10/22/2017 07/01/2016  PHQ - 2 Score 0 0  PHQ- 9 Score 0 -    Physical Exam  Constitutional: He is oriented to person, place, and time. He appears well-developed. No distress.  HENT:  Head: Normocephalic and atraumatic.  Neck: Normal range of motion. Neck supple.  Cardiovascular: Normal rate, regular rhythm and normal heart sounds.  Pulmonary/Chest: Effort normal and breath sounds normal. No respiratory distress.  Musculoskeletal: Normal range of motion. He exhibits no edema.  Neurological: He is alert and oriented to person, place, and time.  Skin: Skin is warm and dry. No rash noted.  Psychiatric: He has a normal mood and affect. His behavior is normal. Thought content normal.  Nursing note and vitals reviewed.   BP (!) 142/100   Pulse 63   Temp 97.8 F (36.6 C)   Resp 16   Ht 5\' 11"  (1.803 m)   Wt 284 lb (128.8 kg)   SpO2 97%   BMI 39.61 kg/m  Assessment and Plan: 1. Essential hypertension Try increasing Avapro to 300 mg (take 2 - 150 mg) and if tolerated, call for higher dose. If not tolerated, will resume 150 mg and add a third agent Continue Amlodipine   No orders of the defined types were placed in this encounter.   Partially dictated using Editor, commissioning. Any errors are unintentional.  Halina Maidens, MD [facility]  12/23/2017   There are no diagnoses linked to this encounter.

## 2017-12-23 NOTE — Patient Instructions (Signed)
Increase Irbesartan to 2 of the 150 mgs daily.  If tolerated, then call me for a new prescription.

## 2017-12-25 ENCOUNTER — Telehealth: Payer: Self-pay

## 2017-12-25 NOTE — Telephone Encounter (Signed)
-----   Message from Lucilla Lame, MD sent at 12/24/2017  8:37 AM EDT ----- This patient needs a hepatitis A vaccination and to be treated for his hepatitis C.

## 2017-12-25 NOTE — Telephone Encounter (Signed)
Pt.notified

## 2017-12-25 NOTE — Telephone Encounter (Signed)
Pt has been notified of lab results. Paperwork has been completed and faxed to [place].

## 2017-12-30 ENCOUNTER — Telehealth: Payer: Self-pay | Admitting: Gastroenterology

## 2017-12-30 NOTE — Telephone Encounter (Signed)
Patient LVM looking for results for lab work

## 2017-12-31 NOTE — Telephone Encounter (Signed)
Pt has already been notified of lab results.

## 2018-03-04 ENCOUNTER — Telehealth: Payer: Self-pay

## 2018-03-04 NOTE — Telephone Encounter (Signed)
Received letter from [employer] notify me they have been unable to reach pt to schedule his next delivery of his Hep C medication. I have contacted pt and advised him to call them back to schedule as his authorization will run out and he will not be able to get his medications.

## 2018-03-10 ENCOUNTER — Encounter: Payer: Self-pay | Admitting: *Deleted

## 2018-03-10 ENCOUNTER — Other Ambulatory Visit: Payer: Self-pay

## 2018-03-17 NOTE — Discharge Instructions (Signed)
General Anesthesia, Adult, Care After °These instructions provide you with information about caring for yourself after your procedure. Your health care provider may also give you more specific instructions. Your treatment has been planned according to current medical practices, but problems sometimes occur. Call your health care provider if you have any problems or questions after your procedure. °What can I expect after the procedure? °After the procedure, it is common to have: °· Vomiting. °· A sore throat. °· Mental slowness. ° °It is common to feel: °· Nauseous. °· Cold or shivery. °· Sleepy. °· Tired. °· Sore or achy, even in parts of your body where you did not have surgery. ° °Follow these instructions at home: °For at least 24 hours after the procedure: °· Do not: °? Participate in activities where you could fall or become injured. °? Drive. °? Use heavy machinery. °? Drink alcohol. °? Take sleeping pills or medicines that cause drowsiness. °? Make important decisions or sign legal documents. °? Take care of children on your own. °· Rest. °Eating and drinking °· If you vomit, drink water, juice, or soup when you can drink without vomiting. °· Drink enough fluid to keep your urine clear or pale yellow. °· Make sure you have little or no nausea before eating solid foods. °· Follow the diet recommended by your health care provider. °General instructions °· Have a responsible adult stay with you until you are awake and alert. °· Return to your normal activities as told by your health care provider. Ask your health care provider what activities are safe for you. °· Take over-the-counter and prescription medicines only as told by your health care provider. °· If you smoke, do not smoke without supervision. °· Keep all follow-up visits as told by your health care provider. This is important. °Contact a health care provider if: °· You continue to have nausea or vomiting at home, and medicines are not helpful. °· You  cannot drink fluids or start eating again. °· You cannot urinate after 8-12 hours. °· You develop a skin rash. °· You have fever. °· You have increasing redness at the site of your procedure. °Get help right away if: °· You have difficulty breathing. °· You have chest pain. °· You have unexpected bleeding. °· You feel that you are having a life-threatening or urgent problem. °This information is not intended to replace advice given to you by your health care provider. Make sure you discuss any questions you have with your health care provider. °Document Released: 10/13/2000 Document Revised: 12/10/2015 Document Reviewed: 06/21/2015 °Elsevier Interactive Patient Education © 2018 Elsevier Inc. ° °

## 2018-03-18 ENCOUNTER — Encounter
Admission: RE | Disposition: A | Discharge status: Home / Self Care | Payer: Self-pay | Source: Ambulatory Visit | Attending: Gastroenterology

## 2018-03-18 ENCOUNTER — Ambulatory Visit: Payer: BLUE CROSS/BLUE SHIELD | Admitting: Anesthesiology

## 2018-03-18 ENCOUNTER — Ambulatory Visit
Admission: RE | Admit: 2018-03-18 | Discharge: 2018-03-18 | Disposition: A | Discharge status: Home / Self Care | Payer: BLUE CROSS/BLUE SHIELD | Source: Ambulatory Visit | Attending: Gastroenterology | Admitting: Gastroenterology

## 2018-03-18 DIAGNOSIS — Z1211 Encounter for screening for malignant neoplasm of colon: Secondary | ICD-10-CM | POA: Insufficient documentation

## 2018-03-18 DIAGNOSIS — D123 Benign neoplasm of transverse colon: Secondary | ICD-10-CM

## 2018-03-18 DIAGNOSIS — I10 Essential (primary) hypertension: Secondary | ICD-10-CM | POA: Insufficient documentation

## 2018-03-18 DIAGNOSIS — Z79899 Other long term (current) drug therapy: Secondary | ICD-10-CM | POA: Diagnosis not present

## 2018-03-18 DIAGNOSIS — Z6839 Body mass index (BMI) 39.0-39.9, adult: Secondary | ICD-10-CM | POA: Diagnosis not present

## 2018-03-18 DIAGNOSIS — B1920 Unspecified viral hepatitis C without hepatic coma: Secondary | ICD-10-CM | POA: Diagnosis not present

## 2018-03-18 HISTORY — DX: Unspecified viral hepatitis C without hepatic coma: B19.20

## 2018-03-18 HISTORY — PX: POLYPECTOMY: SHX5525

## 2018-03-18 HISTORY — PX: COLONOSCOPY WITH PROPOFOL: SHX5780

## 2018-03-18 SURGERY — COLONOSCOPY WITH PROPOFOL
Anesthesia: General

## 2018-03-18 MED ORDER — ACETAMINOPHEN 160 MG/5ML PO SOLN
325.0000 mg | Freq: Once | ORAL | Status: DC
Start: 2018-03-18 — End: 2018-03-18

## 2018-03-18 MED ORDER — PROPOFOL 10 MG/ML IV BOLUS
INTRAVENOUS | Status: DC | PRN
  Administered 2018-03-18 (×2): 20 mg via INTRAVENOUS
  Administered 2018-03-18: 100 mg via INTRAVENOUS
  Administered 2018-03-18: 30 mg via INTRAVENOUS
  Administered 2018-03-18 (×2): 20 mg via INTRAVENOUS
  Administered 2018-03-18 (×2): 30 mg via INTRAVENOUS
  Administered 2018-03-18: 50 mg via INTRAVENOUS
  Administered 2018-03-18 (×2): 30 mg via INTRAVENOUS
  Administered 2018-03-18: 20 mg via INTRAVENOUS

## 2018-03-18 MED ORDER — SODIUM CHLORIDE 0.9 % IV SOLN: INTRAVENOUS | Status: DC

## 2018-03-18 MED ORDER — LACTATED RINGERS IV SOLN
INTRAVENOUS | Status: DC
  Administered 2018-03-18: 09:00:00 via INTRAVENOUS

## 2018-03-18 MED ORDER — ACETAMINOPHEN 325 MG PO TABS: 325.0000 mg | ORAL_TABLET | Freq: Once | ORAL | Status: DC

## 2018-03-18 MED ORDER — LIDOCAINE HCL (CARDIAC) PF 100 MG/5ML IV SOSY
PREFILLED_SYRINGE | INTRAVENOUS | Status: DC | PRN
  Administered 2018-03-18: 40 mg via INTRAVENOUS

## 2018-03-18 MED ORDER — STERILE WATER FOR IRRIGATION IR SOLN
Status: DC | PRN
  Administered 2018-03-18: 10:00:00

## 2018-03-18 SURGICAL SUPPLY — 24 items
CANISTER SUCT 1200ML W/VALVE (MISCELLANEOUS) ×2 IMPLANT
CLIP HMST 235XBRD CATH ROT (MISCELLANEOUS) IMPLANT
CLIP RESOLUTION 360 11X235 (MISCELLANEOUS)
ELECT REM PT RETURN 9FT ADLT (ELECTROSURGICAL)
ELECTRODE REM PT RTRN 9FT ADLT (ELECTROSURGICAL) IMPLANT
FCP ESCP3.2XJMB 240X2.8X (MISCELLANEOUS)
FORCEPS BIOP RAD 4 LRG CAP 4 (CUTTING FORCEPS) IMPLANT
FORCEPS BIOP RJ4 240 W/NDL (MISCELLANEOUS)
FORCEPS ESCP3.2XJMB 240X2.8X (MISCELLANEOUS) IMPLANT
GOWN CVR UNV OPN BCK APRN NK (MISCELLANEOUS) ×2 IMPLANT
GOWN ISOL THUMB LOOP REG UNIV (MISCELLANEOUS) ×2
INJECTOR VARIJECT VIN23 (MISCELLANEOUS) IMPLANT
KIT DEFENDO VALVE AND CONN (KITS) IMPLANT
KIT ENDO PROCEDURE OLY (KITS) ×2 IMPLANT
MARKER SPOT ENDO TATTOO 5ML (MISCELLANEOUS) IMPLANT
PROBE APC STR FIRE (PROBE) IMPLANT
RETRIEVER NET ROTH 2.5X230 LF (MISCELLANEOUS) IMPLANT
SNARE SHORT THROW 13M SML OVAL (MISCELLANEOUS) IMPLANT
SNARE SHORT THROW 30M LRG OVAL (MISCELLANEOUS) ×2 IMPLANT
SNARE SNG USE RND 15MM (INSTRUMENTS) IMPLANT
SPOT EX ENDOSCOPIC TATTOO (MISCELLANEOUS)
TRAP ETRAP POLY (MISCELLANEOUS) ×2 IMPLANT
VARIJECT INJECTOR VIN23 (MISCELLANEOUS)
WATER STERILE IRR 250ML POUR (IV SOLUTION) ×2 IMPLANT

## 2018-03-18 NOTE — Anesthesia Preprocedure Evaluation (Signed)
Anesthesia Evaluation  Patient identified by MRN, date of birth, ID band Patient awake    Reviewed: Allergy & Precautions, H&P , NPO status , Patient's Chart, lab work & pertinent test results  Airway Mallampati: II  TM Distance: >3 FB Neck ROM: full    Dental  (+) Partial Upper   Pulmonary    Pulmonary exam normal breath sounds clear to auscultation       Cardiovascular hypertension, Normal cardiovascular exam Rhythm:regular Rate:Normal     Neuro/Psych    GI/Hepatic (+) Hepatitis -, C  Endo/Other  Morbid obesity  Renal/GU      Musculoskeletal   Abdominal   Peds  Hematology   Anesthesia Other Findings   Reproductive/Obstetrics                             Anesthesia Physical Anesthesia Plan  ASA: II  Anesthesia Plan: General   Post-op Pain Management:    Induction: Intravenous  PONV Risk Score and Plan: 2 and Propofol infusion and Treatment may vary due to age or medical condition  Airway Management Planned: Natural Airway  Additional Equipment:   Intra-op Plan:   Post-operative Plan:   Informed Consent: I have reviewed the patients History and Physical, chart, labs and discussed the procedure including the risks, benefits and alternatives for the proposed anesthesia with the patient or authorized representative who has indicated his/her understanding and acceptance.     Plan Discussed with: CRNA  Anesthesia Plan Comments:         Anesthesia Quick Evaluation

## 2018-03-18 NOTE — Anesthesia Postprocedure Evaluation (Signed)
Anesthesia Post Note  Patient: Andrew Hodge  Procedure(s) Performed: COLONOSCOPY WITH PROPOFOL (N/A ) POLYPECTOMY (N/A )  Patient location during evaluation: PACU Anesthesia Type: General Level of consciousness: awake and alert and oriented Pain management: satisfactory to patient Vital Signs Assessment: post-procedure vital signs reviewed and stable Respiratory status: spontaneous breathing, nonlabored ventilation and respiratory function stable Cardiovascular status: blood pressure returned to baseline and stable Postop Assessment: Adequate PO intake and No signs of nausea or vomiting Anesthetic complications: no    Raliegh Ip

## 2018-03-18 NOTE — H&P (Signed)
Lucilla Lame, MD [facility] [address], [place], [place] [address] Phone: [phone] Fax : [phone]  Primary Care Physician:  Glean Hess, MD Primary Gastroenterologist:  Dr. Allen Norris  Pre-Procedure History & Physical: HPI:  Andrew Hodge is a 57 y.o. male is here for a screening colonoscopy.   Past Medical History:  Diagnosis Date  . Hepatitis C    recieving po meds  . Hypertension   . Impotence     Past Surgical History:  Procedure Laterality Date  . CLOSED MANIPULATION SHOULDER     dislocated shoulder - age 89    Prior to Admission medications   Medication Sig Start Date End Date Taking? Authorizing Provider  irbesartan (AVAPRO) 150 MG tablet Take 1 tablet (150 mg total) by mouth daily. 10/22/17  Yes Glean Hess, MD  Ledipasvir-Sofosbuvir 90-400 MG TABS Take by mouth daily.   Yes [provider]  sildenafil (VIAGRA) 100 MG tablet Take 0.5-1 tablets (50-100 mg total) by mouth daily as needed for erectile dysfunction. 09/01/16  Yes Glean Hess, MD  amLODipine (NORVASC) 10 MG tablet Take 1 tablet (10 mg total) by mouth daily. Patient not taking: Reported on 03/10/2018 10/22/17   Glean Hess, MD  Olopatadine HCl 0.2 % SOLN 1 drop to each eye daily. Patient not taking: Reported on 03/10/2018 06/30/17   Coral Spikes, DO    Allergies as of 12/08/2017  . (No Known Allergies)    Family History  Problem Relation Age of Onset  . Diabetes Father     Social History   Socioeconomic History  . Marital status: Single    Spouse name: Not on file  . Number of children: Not on file  . Years of education: Not on file  . Highest education level: Not on file  Occupational History    Comment: self employed  Social Needs  . Financial resource strain: Not on file  . Food insecurity:    Worry: Not on file    Inability: Not on file  . Transportation needs:    Medical: Not on file    Non-medical: Not on file  Tobacco Use  . Smoking  status: Never Smoker  . Smokeless tobacco: Never Used  Substance and Sexual Activity  . Alcohol use: Yes    Alcohol/week: 6.0 standard drinks    Types: 6 Cans of beer per week    Comment: on weekends  . Drug use: No  . Sexual activity: Not on file  Lifestyle  . Physical activity:    Days per week: Not on file    Minutes per session: Not on file  . Stress: Not on file  Relationships  . Social connections:    Talks on phone: Not on file    Gets together: Not on file    Attends religious service: Not on file    Active member of club or organization: Not on file    Attends meetings of clubs or organizations: Not on file    Relationship status: Not on file  . Intimate partner violence:    Fear of current or ex partner: Not on file    Emotionally abused: Not on file    Physically abused: Not on file    Forced sexual activity: Not on file  Other Topics Concern  . Not on file  Social History Narrative  . Not on file    Review of Systems: See HPI, otherwise negative ROS  Physical Exam: BP (!) 159/98  Pulse 63   Temp 98.1 F (36.7 C) (Temporal)   Resp 16   Ht 5\' 11"  (1.803 m)   Wt 125.2 kg   SpO2 100%   BMI 38.49 kg/m  General:   Alert,  pleasant and cooperative in NAD Head:  Normocephalic and atraumatic. Neck:  Supple; no masses or thyromegaly. Lungs:  Clear throughout to auscultation.    Heart:  Regular rate and rhythm. Abdomen:  Soft, nontender and nondistended. Normal bowel sounds, without guarding, and without rebound.   Neurologic:  Alert and  oriented x4;  grossly normal neurologically.  Impression/Plan: Andrew Hodge is now here to undergo a screening colonoscopy.  Risks, benefits, and alternatives regarding colonoscopy have been reviewed with the patient.  Questions have been answered.  All parties agreeable.

## 2018-03-18 NOTE — Transfer of Care (Signed)
Immediate Anesthesia Transfer of Care Note  Patient: Andrew Hodge  Procedure(s) Performed: COLONOSCOPY WITH PROPOFOL (N/A )  Patient Location: PACU  Anesthesia Type: General  Level of Consciousness: awake, alert  and patient cooperative  Airway and Oxygen Therapy: Patient Spontanous Breathing and Patient connected to supplemental oxygen  Post-op Assessment: Post-op Vital signs reviewed, Patient's Cardiovascular Status Stable, Respiratory Function Stable, Patent Airway and No signs of Nausea or vomiting  Post-op Vital Signs: Reviewed and stable  Complications: No apparent anesthesia complications

## 2018-03-18 NOTE — Op Note (Signed)
[facility] Gastroenterology Patient Name: Andrew Hodge Procedure Date: 03/18/2018 9:49 AM MRN: [id number] Account #: [id number] Date of Birth: 03/14/1961 Admit Type: Outpatient Age: 57 Room: [facility] OR ROOM 01 Gender: Male Note Status: Finalized Procedure:            Colonoscopy Indications:          Screening for colorectal malignant neoplasm Providers:            Lucilla Lame MD, MD Referring MD:         Halina Maidens, MD (Referring MD) Medicines:            Propofol per Anesthesia Complications:        No immediate complications. Procedure:            Pre-Anesthesia Assessment:                       - Prior to the procedure, a History and Physical was                        performed, and patient medications and allergies were                        reviewed. The patient's tolerance of previous                        anesthesia was also reviewed. The risks and benefits of                        the procedure and the sedation options and risks were                        discussed with the patient. All questions were                        answered, and informed consent was obtained. Prior                        Anticoagulants: The patient has taken no previous                        anticoagulant or antiplatelet agents. ASA Grade                        Assessment: II - A patient with mild systemic disease.                        After reviewing the risks and benefits, the patient was                        deemed in satisfactory condition to undergo the                        procedure.                       After obtaining informed consent, the colonoscope was                        passed under direct vision. Throughout the procedure,  the patient's blood pressure, pulse, and oxygen                        saturations were monitored continuously. The was                        introduced through the anus and advanced to the the                cecum, identified by appendiceal orifice and ileocecal                        valve. The colonoscopy was performed without                        difficulty. The patient tolerated the procedure well.                        The quality of the bowel preparation was excellent. Findings:      The perianal and digital rectal examinations were normal.      Three sessile polyps were found in the transverse colon. The polyps were       6 to 7 mm in size. These polyps were removed with a cold snare.       Resection and retrieval were complete. Impression:           - Three 6 to 7 mm polyps in the transverse colon,                        removed with a cold snare. Resected and retrieved. Recommendation:       - Discharge patient to home.                       - Resume previous diet.                       - Continue present medications.                       - Await pathology results.                       - Repeat colonoscopy in 5 years if polyp adenoma and 10                        years if hyperplastic Procedure Code(s):    --- Professional ---                       [phone], Colonoscopy, flexible; with removal of tumor(s),                        polyp(s), or other lesion(s) by snare technique Diagnosis Code(s):    --- Professional ---                       Z12.11, Encounter for screening for malignant neoplasm                        of colon                       D12.3, Benign neoplasm of transverse  colon (hepatic                        flexure or splenic flexure) CPT copyright 2017 American Medical Association. All rights reserved. The codes documented in this report are preliminary and upon coder review may  be revised to meet current compliance requirements. Lucilla Lame MD, MD 03/18/2018 10:07:48 AM This report has been signed electronically. Number of Addenda: 0 Note Initiated On: 03/18/2018 9:49 AM Scope Withdrawal Time: 0 hours 8 minutes 17 seconds  Total Procedure Duration:  0 hours 10 minutes 55 seconds       [facility]

## 2018-03-18 NOTE — Anesthesia Procedure Notes (Signed)
Procedure Name: MAC Date/Time: 03/18/2018 9:47 AM Performed by: Janna Arch, CRNA Pre-anesthesia Checklist: Patient identified, Emergency Drugs available, Suction available and Patient being monitored Patient Re-evaluated:Patient Re-evaluated prior to induction Oxygen Delivery Method: Nasal cannula

## 2018-03-19 ENCOUNTER — Other Ambulatory Visit: Payer: Self-pay | Admitting: Internal Medicine

## 2018-03-19 ENCOUNTER — Encounter: Payer: Self-pay | Admitting: Gastroenterology

## 2018-03-23 ENCOUNTER — Encounter: Payer: Self-pay | Admitting: Gastroenterology

## 2018-03-25 ENCOUNTER — Encounter: Payer: Self-pay | Admitting: Gastroenterology

## 2018-04-22 ENCOUNTER — Other Ambulatory Visit: Payer: Self-pay | Admitting: Internal Medicine

## 2018-04-22 DIAGNOSIS — I10 Essential (primary) hypertension: Secondary | ICD-10-CM

## 2018-05-26 ENCOUNTER — Encounter (INDEPENDENT_AMBULATORY_CARE_PROVIDER_SITE_OTHER): Payer: Self-pay

## 2018-05-26 ENCOUNTER — Encounter: Payer: Self-pay | Admitting: Gastroenterology

## 2018-05-26 ENCOUNTER — Other Ambulatory Visit
Admission: RE | Admit: 2018-05-26 | Discharge: 2018-05-26 | Disposition: A | Discharge status: Home / Self Care | Payer: BLUE CROSS/BLUE SHIELD | Source: Ambulatory Visit | Attending: Gastroenterology | Admitting: Gastroenterology

## 2018-05-26 ENCOUNTER — Ambulatory Visit: Payer: BLUE CROSS/BLUE SHIELD | Admitting: Gastroenterology

## 2018-05-26 VITALS — BP 173/100 | HR 63 | Ht 71.0 in | Wt 284.0 lb

## 2018-05-26 DIAGNOSIS — R768 Other specified abnormal immunological findings in serum: Secondary | ICD-10-CM

## 2018-05-26 LAB — HEPATIC FUNCTION PANEL
ALT: 38 U/L (ref 0–44)
AST: 24 U/L (ref 15–41)
Albumin: 3.9 g/dL (ref 3.5–5.0)
Alkaline Phosphatase: 69 U/L (ref 38–126)
BILIRUBIN TOTAL: 0.5 mg/dL (ref 0.3–1.2)
Total Protein: 7.3 g/dL (ref 6.5–8.1)

## 2018-05-26 NOTE — Progress Notes (Signed)
    Primary Care Physician: Glean Hess, MD  Primary Gastroenterologist:  Dr. Lucilla Lame  Chief Complaint  Patient presents with  . Hepatitis C    Treatment completed    HPI: Andrew Hodge is a 57 y.o. male here for follow-up after completing treatment for his hepatitis C with Harvoni.  The patient reports that he did not have any side effects except that when he took the medication at night it made him sleep well.  The patient denies drinking during the treatment nor having any other side effects.  He is now coming for evaluation post treatment.  Current Outpatient Medications  Medication Sig Dispense Refill  . amLODipine (NORVASC) 10 MG tablet TAKE 1 TABLET BY MOUTH EVERY DAY 90 tablet 1  . irbesartan (AVAPRO) 150 MG tablet Take 1 tablet (150 mg total) by mouth daily. 30 tablet 5  . Ledipasvir-Sofosbuvir 90-400 MG TABS Take by mouth daily.    Marland Kitchen VIAGRA 100 MG tablet TAKE 0.5-1 TABLETS (50-100 MG TOTAL) BY MOUTH DAILY AS NEEDED FOR ERECTILE DYSFUNCTION. 2 tablet 29  . Olopatadine HCl 0.2 % SOLN 1 drop to each eye daily. (Patient not taking: Reported on 03/10/2018) 2.5 mL 0   No current facility-administered medications for this visit.     Allergies as of 05/26/2018  . (No Known Allergies)    ROS:  General: Negative for anorexia, weight loss, fever, chills, fatigue, weakness. ENT: Negative for hoarseness, difficulty swallowing , nasal congestion. CV: Negative for chest pain, angina, palpitations, dyspnea on exertion, peripheral edema.  Respiratory: Negative for dyspnea at rest, dyspnea on exertion, cough, sputum, wheezing.  GI: See history of present illness. GU:  Negative for dysuria, hematuria, urinary incontinence, urinary frequency, nocturnal urination.  Endo: Negative for unusual weight change.    Physical Examination:   BP (!) 173/100   Pulse 63   Ht 5\' 11"  (1.803 m)   Wt 284 lb (128.8 kg)   BMI 39.61 kg/m   General: Well-nourished, well-developed in no  acute distress.  Eyes: No icterus. Conjunctivae pink. Mouth: Oropharyngeal mucosa moist and pink , no lesions erythema or exudate. Lungs: Clear to auscultation bilaterally. Non-labored. Heart: Regular rate and rhythm, no murmurs rubs or gallops.  Abdomen: Bowel sounds are normal, nontender, nondistended, no hepatosplenomegaly or masses, no abdominal bruits or hernia , no rebound or guarding.   Extremities: No lower extremity edema. No clubbing or deformities. Neuro: Alert and oriented x 3.  Grossly intact. Skin: Warm and dry, no jaundice.   Psych: Alert and cooperative, normal mood and affect.  Labs:    Imaging Studies: No results found.  Assessment and Plan:   Andrew Hodge is a 57 y.o. y/o male who comes in after being treated for his hepatitis C with Harvoni.  The patient had very little if any side effects.  The patient is doing well and is here for posttreatment follow-up.  He will be having blood sent off for his viral load and LFTs.  He will then have it checked in 1 year to make sure he is a sustained viral responder.  The patient has been explained the plan and agrees with it.    Lucilla Lame, MD. [facility]   Note: This dictation was prepared with Dragon dictation along with smaller phrase technology. Any transcriptional errors that result from this process are unintentional.

## 2018-05-27 LAB — HCV RNA QUANT
HCV QUANT LOG: 2.342 {Log_IU}/mL (ref >1.70)
HCV Quantitative: 220 IU/mL (ref >50)

## 2018-06-02 ENCOUNTER — Other Ambulatory Visit: Payer: Self-pay | Admitting: Internal Medicine

## 2018-06-02 DIAGNOSIS — I10 Essential (primary) hypertension: Secondary | ICD-10-CM

## 2018-08-12 ENCOUNTER — Other Ambulatory Visit: Payer: Self-pay

## 2018-10-26 ENCOUNTER — Encounter: Payer: Self-pay | Admitting: Internal Medicine

## 2018-10-26 ENCOUNTER — Ambulatory Visit (INDEPENDENT_AMBULATORY_CARE_PROVIDER_SITE_OTHER): Payer: BLUE CROSS/BLUE SHIELD | Admitting: Internal Medicine

## 2018-10-26 VITALS — BP 122/82 | HR 67 | Ht 71.0 in | Wt 279.0 lb

## 2018-10-26 DIAGNOSIS — S8011XA Contusion of right lower leg, initial encounter: Secondary | ICD-10-CM | POA: Diagnosis not present

## 2018-10-26 DIAGNOSIS — I10 Essential (primary) hypertension: Secondary | ICD-10-CM | POA: Diagnosis not present

## 2018-10-26 DIAGNOSIS — Z125 Encounter for screening for malignant neoplasm of prostate: Secondary | ICD-10-CM | POA: Diagnosis not present

## 2018-10-26 DIAGNOSIS — Z0000 Encounter for general adult medical examination without abnormal findings: Secondary | ICD-10-CM | POA: Diagnosis not present

## 2018-10-26 DIAGNOSIS — R7689 Other specified abnormal immunological findings in serum: Secondary | ICD-10-CM

## 2018-10-26 DIAGNOSIS — R768 Other specified abnormal immunological findings in serum: Secondary | ICD-10-CM | POA: Diagnosis not present

## 2018-10-26 HISTORY — DX: Contusion of right lower leg, initial encounter: S80.11XA

## 2018-10-26 LAB — POCT URINALYSIS DIPSTICK
Bilirubin, UA: NEGATIVE
Blood, UA: NEGATIVE
Glucose, UA: NEGATIVE
Ketones, UA: NEGATIVE
Leukocytes, UA: NEGATIVE
Nitrite, UA: NEGATIVE
Protein, UA: NEGATIVE
Spec Grav, UA: 1.015 (ref 1.010–1.025)
Urobilinogen, UA: 0.2 E.U./dL
pH, UA: 6.5 (ref 5.0–8.0)

## 2018-10-26 MED ORDER — AMLODIPINE BESYLATE 10 MG PO TABS: 10.0000 mg | ORAL_TABLET | Freq: Every day | ORAL | 1 refills | Status: DC

## 2018-10-26 MED ORDER — IRBESARTAN 150 MG PO TABS: 150.0000 mg | ORAL_TABLET | Freq: Every day | ORAL | 1 refills | Status: DC

## 2018-10-26 NOTE — Patient Instructions (Addendum)
This information is directly available on the CDC website: RunningShows.co.za.html    Source:CDC Reference to specific commercial products, manufacturers, companies, or trademarks does not constitute its endorsement or recommendation by the [place], [place], or Centers for [employer] and Prevention. This information is directly available on the CDC website: RunningShows.co.za.html    Source:CDC Reference to specific commercial products, manufacturers, companies, or trademarks does not constitute its endorsement or recommendation by the [place], [place], or Centers for [employer] and Prevention.
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
## 2018-10-27 LAB — CBC WITH DIFFERENTIAL/PLATELET
Basophils Absolute: 0 10*3/uL (ref 0.0–0.2)
Basos: 1 %
EOS (ABSOLUTE): 0.1 10*3/uL (ref 0.0–0.4)
Eos: 3 %
Hematocrit: 42.8 % (ref 37.5–51.0)
Hemoglobin: 14.5 g/dL (ref 13.0–17.7)
Immature Grans (Abs): 0 10*3/uL (ref 0.0–0.1)
Immature Granulocytes: 1 %
Lymphocytes Absolute: 1.2 10*3/uL (ref 0.7–3.1)
Lymphs: 28 %
MCH: 29.2 pg (ref 26.6–33.0)
MCHC: 33.9 g/dL (ref 31.5–35.7)
MCV: 86 fL (ref 79–97)
Monocytes Absolute: 0.5 10*3/uL (ref 0.1–0.9)
Monocytes: 12 %
Neutrophils Absolute: 2.3 10*3/uL (ref 1.4–7.0)
Neutrophils: 55 %
Platelets: 271 10*3/uL (ref 150–450)
RBC: 4.97 x10E6/uL (ref 4.14–5.80)
RDW: 12.6 % (ref 11.6–15.4)
WBC: 4.2 10*3/uL (ref 3.4–10.8)

## 2018-10-27 LAB — COMPREHENSIVE METABOLIC PANEL
ALT: 35 IU/L (ref 0–44)
AST: 19 IU/L (ref 0–40)
Albumin/Globulin Ratio: 1.4 (ref 1.2–2.2)
Albumin: 4.4 g/dL (ref 3.8–4.9)
Alkaline Phosphatase: 118 IU/L — ABNORMAL HIGH (ref 39–117)
BUN/Creatinine Ratio: 17 (ref 9–20)
BUN: 13 mg/dL (ref 6–24)
Bilirubin Total: 0.5 mg/dL (ref 0.0–1.2)
CO2: 23 mmol/L (ref 20–29)
Calcium: 9.8 mg/dL (ref 8.7–10.2)
Chloride: 103 mmol/L (ref 96–106)
Creatinine, Ser: 0.75 mg/dL — ABNORMAL LOW (ref 0.76–1.27)
GFR calc Af Amer: 118 mL/min/{1.73_m2} (ref >59)
GFR calc non Af Amer: 102 mL/min/{1.73_m2} (ref >59)
Globulin, Total: 3.1 g/dL (ref 1.5–4.5)
Glucose: 100 mg/dL — ABNORMAL HIGH (ref 65–99)
Potassium: 4.3 mmol/L (ref 3.5–5.2)
Sodium: 141 mmol/L (ref 134–144)
Total Protein: 7.5 g/dL (ref 6.0–8.5)

## 2018-10-27 LAB — LIPID PANEL
Chol/HDL Ratio: 3.7 ratio (ref 0.0–5.0)
Cholesterol, Total: 193 mg/dL (ref 100–199)
HDL: 52 mg/dL (ref >39)
LDL Calculated: 113 mg/dL — ABNORMAL HIGH (ref 0–99)
Triglycerides: 141 mg/dL (ref 0–149)
VLDL Cholesterol Cal: 28 mg/dL (ref 5–40)

## 2018-10-27 LAB — HIV ANTIBODY (ROUTINE TESTING W REFLEX): HIV Screen 4th Generation wRfx: NONREACTIVE

## 2018-10-27 LAB — TSH: TSH: 4.02 u[IU]/mL (ref 0.450–4.500)

## 2018-10-27 LAB — PSA: Prostate Specific Ag, Serum: 0.2 ng/mL (ref 0.0–4.0)

## 2019-02-01 IMAGING — US US ABDOMEN LIMITED W/ ELASTOGRAPHY
2 series · 13 of 25 positions shown · non-contrast
Comparison: None.

CLINICAL DATA: Hepatitis-C.  Prior cholecystectomy.

EXAM:
US ABDOMEN LIMITED - RIGHT UPPER QUADRANT
ULTRASOUND HEPATIC ELASTOGRAPHY
TECHNIQUE: Limited right upper quadrant abdominal ultrasound was performed. In
addition, ultrasound elastography evaluation of the liver was
performed. A region of interest was placed in the right lobe of the
liver. Following application of a compressive sonographic pulse,
shear waves were detected in the adjacent hepatic tissue and the
shear wave velocity was calculated. Multiple assessments were
performed at the selected site. Median shear wave velocity is
correlated to a Metavir fibrosis score.

[Series 1: us abdomen limited w/ elastography · 0.25mm/px · 9 of 47 slices shown (1 of 2)]
[im 1/47]
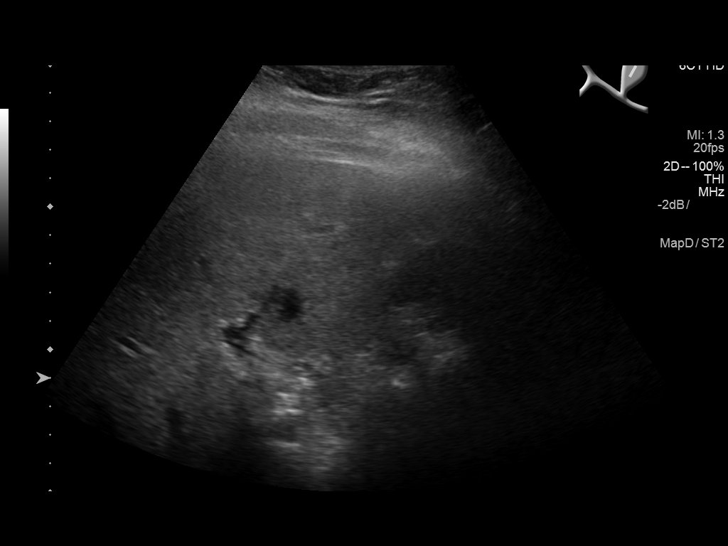
[im 6/47]
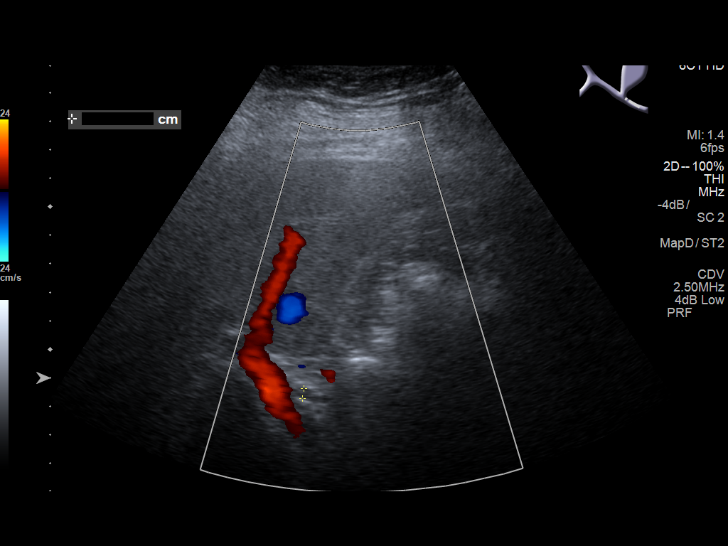
[im 11/47]
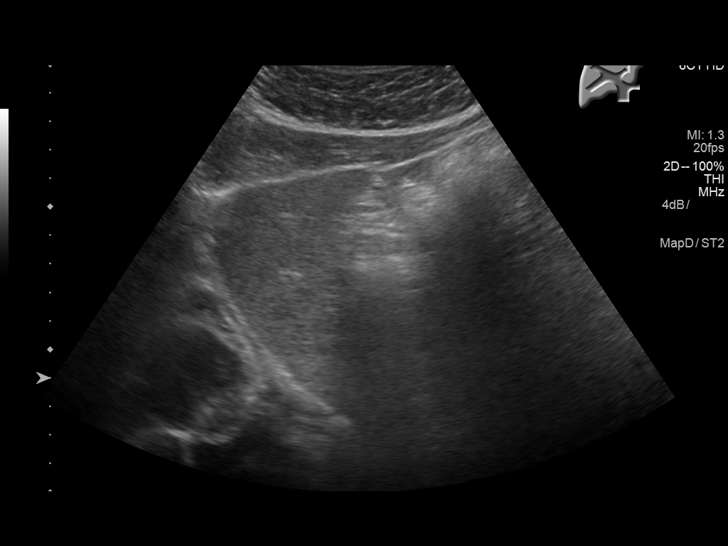
[im 17/47]
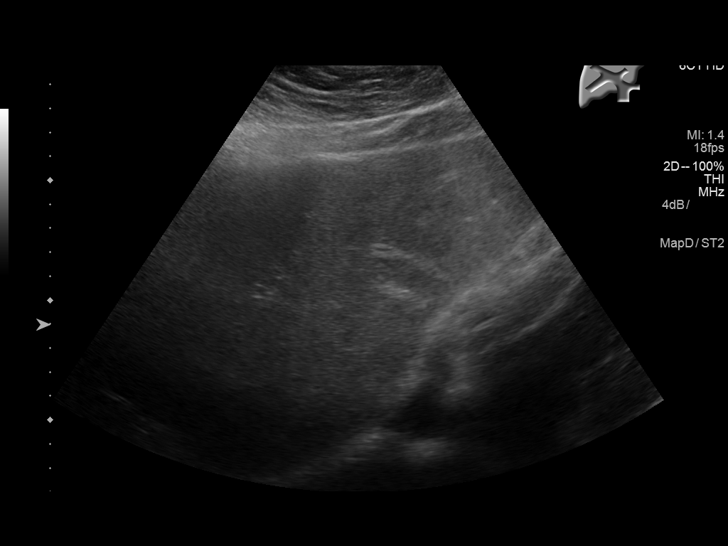
[im 22/47]
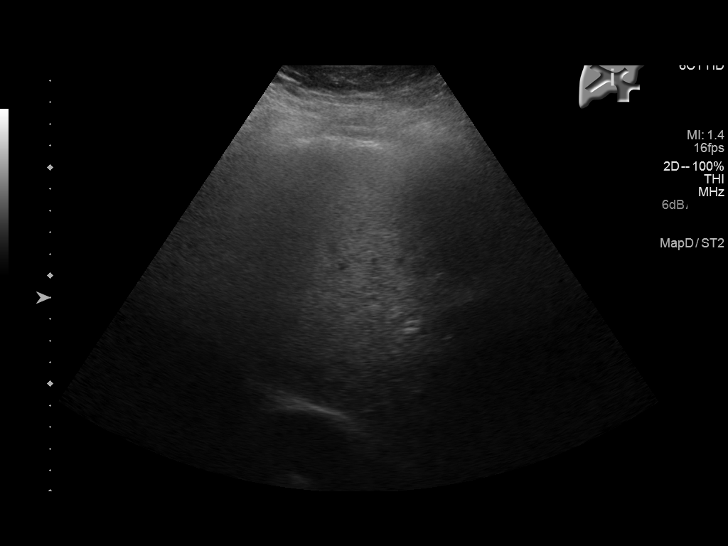
[im 28/47]
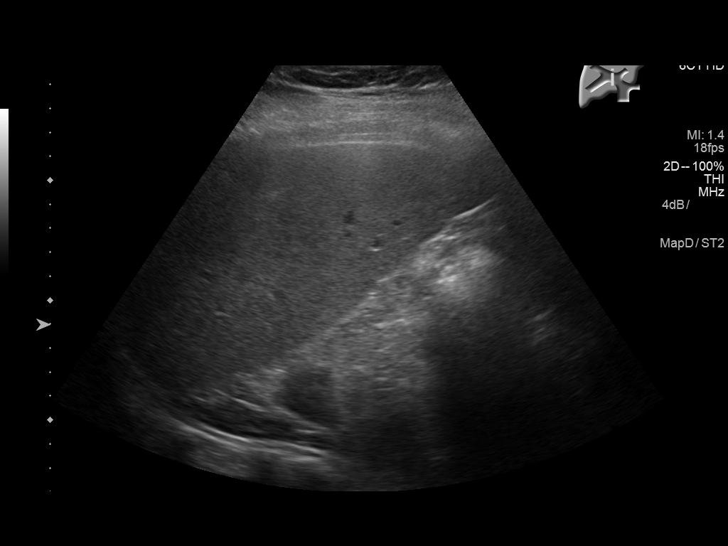
[im 33/47]
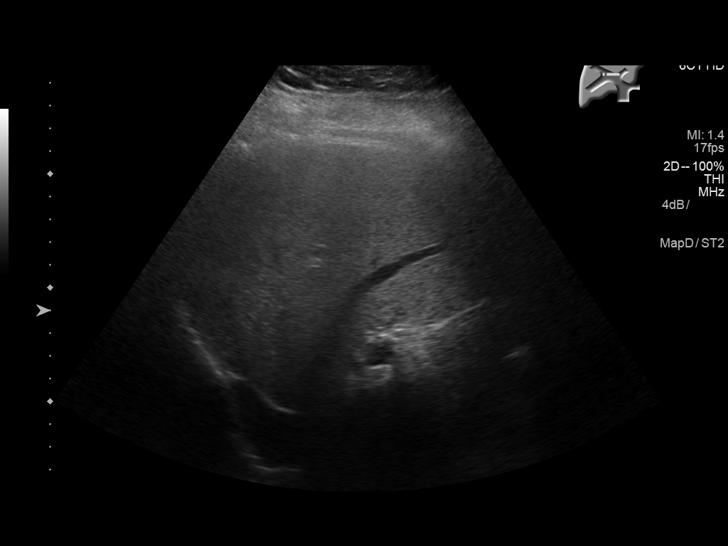
[im 38/47]
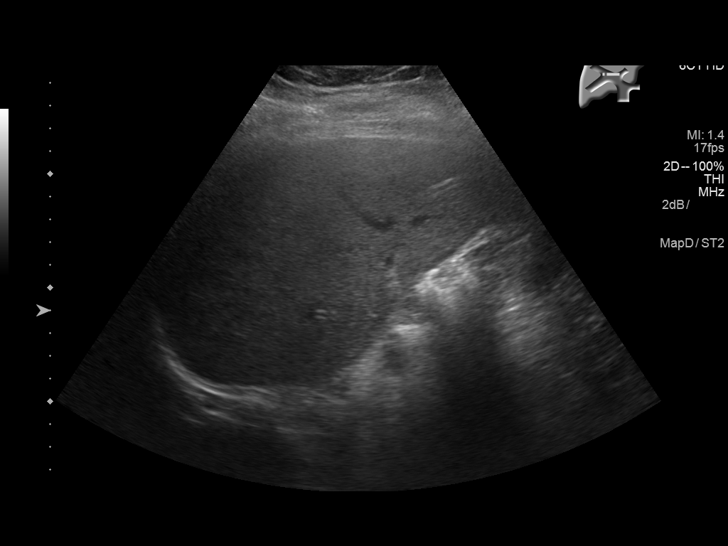
[im 44/47]
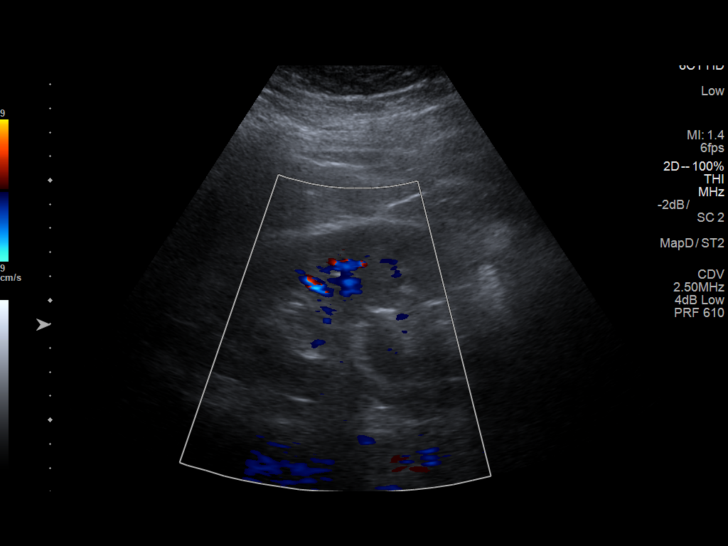

[Series 2001: us abdomen limited w/ elastography · 0.28mm/px · 4 of 19 slices shown (2 of 2)]
[im 1/19]
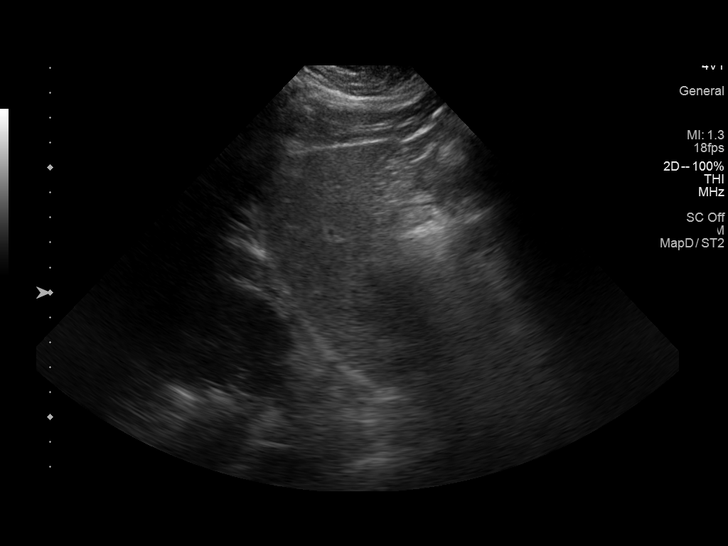
[im 7/19]
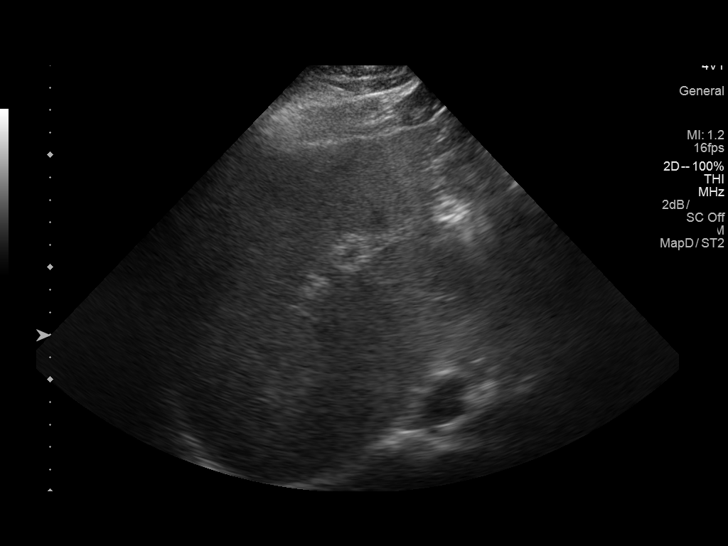
[im 13/19]
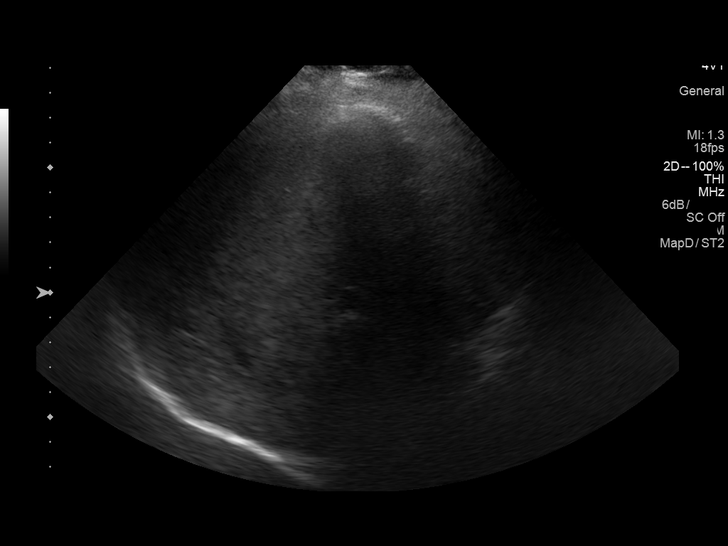
[im 19/19]
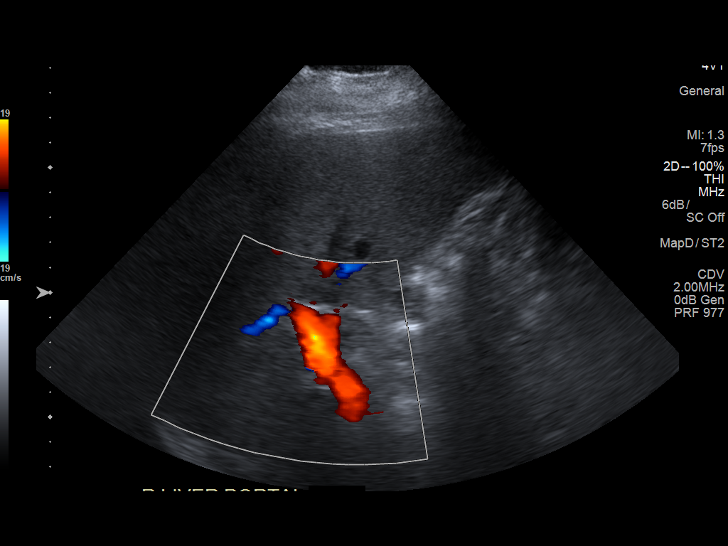

[13 of 25 positions shown; findings below may reference images not displayed]

FINDINGS: ULTRASOUND ABDOMEN LIMITED RIGHT UPPER QUADRANT

Gallbladder:

Surgically absent.

Common bile duct:

Diameter: 4 mm, within normal limits.

Liver:

Mildly increased echogenicity of the hepatic parenchyma, consistent
with hepatic steatosis. No focal mass lesion identified. Portal vein
is patent on color Doppler imaging with normal direction of blood
flow towards the liver.

Other:

Mild right renal pelvicaliectasis incidentally noted.

ULTRASOUND HEPATIC ELASTOGRAPHY

Device: Siemens Helix VTQ

Patient position: Oblique

Transducer [id number]

Number of measurements: 6

Hepatic segment:  8

Median velocity:   2.03 m/sec

IQR:

IQR/Median velocity ratio:

Corresponding Metavir fibrosis score:  F2 + some F3

Risk of fibrosis: Moderate

Limitations of exam: Patient breathing motion

Please note that abnormal shear wave velocities may also be
identified in clinical settings other than with hepatic fibrosis,
such as: acute hepatitis, elevated right heart and central venous
pressures including use of beta blockers, Esco disease
(Ferienhaus), infiltrative processes such as
mastocytosis/amyloidosis/infiltrative tumor, extrahepatic
cholestasis, in the post-prandial state, and liver transplantation.
Correlation with patient history, laboratory data, and clinical
condition recommended.
IMPRESSION: ULTRASOUND ABDOMEN:
Mild hepatic steatosis.  No hepatic mass identified.

Prior cholecystectomy.  No evidence of biliary ductal dilatation.

Incidentally noted mild right renal pelvicaliectasis. Consider
abdomen pelvis CT for further evaluation if clinically warranted.

ULTRASOUND HEPATIC ELASTOGRAHY:

Technically suboptimal exam due to patient breathing motion,
resulting in inconsistent velocity measurements.

Median hepatic shear wave velocity is calculated at 2.0 m/sec.

Corresponding Metavir fibrosis score is F2 + some F3.

Risk of fibrosis is Moderate.

Follow-up: Additional testing appropriate

## 2019-05-16 ENCOUNTER — Telehealth: Payer: Self-pay | Admitting: Emergency Medicine

## 2019-05-16 ENCOUNTER — Other Ambulatory Visit: Payer: Self-pay

## 2019-05-16 ENCOUNTER — Ambulatory Visit
Admission: EM | Admit: 2019-05-16 | Discharge: 2019-05-16 | Disposition: A | Discharge status: Home / Self Care | Payer: BLUE CROSS/BLUE SHIELD | Attending: Emergency Medicine | Admitting: Emergency Medicine

## 2019-05-16 DIAGNOSIS — Z20828 Contact with and (suspected) exposure to other viral communicable diseases: Secondary | ICD-10-CM

## 2019-05-16 DIAGNOSIS — R030 Elevated blood-pressure reading, without diagnosis of hypertension: Secondary | ICD-10-CM

## 2019-05-16 DIAGNOSIS — T50901A Poisoning by unspecified drugs, medicaments and biological substances, accidental (unintentional), initial encounter: Secondary | ICD-10-CM

## 2019-05-16 DIAGNOSIS — Z20822 Contact with and (suspected) exposure to covid-19: Secondary | ICD-10-CM

## 2019-05-16 NOTE — Discharge Instructions (Addendum)
Take blood pressure medication, as prescribed. Monitor self. Drink plenty of fluids. Stop the supplement for 3 days, and if you chose to restart, then take only as prescribed.   Follow up with your primary care physician this week. Return to Urgent care or ER for chest pain, shortness of breath, swelling, difficulty swallowing, new or worsening concerns.

## 2019-05-16 NOTE — Telephone Encounter (Signed)
Called poison control.  Per poison control the additional dosage of 1 pill will not cause any harm.

## 2019-05-16 NOTE — ED Provider Notes (Signed)
[facility] ____________________________________________  Time seen: Approximately 3:47 PM  I have reviewed the triage vital signs and the nursing notes.   HISTORY  Chief Complaint Medication Error  HPI Andrew Hodge is a 58 y.o. male presenting for evaluation after accidental ingestion of twice amount of "Meticore weight loss supplement "this morning at 1030 AM.  Patient states he usually takes one of the supplements every other day, but accidentally took 2 this morning.  States around 1030 he then felt "woozy ".  States he also drank a large cup of coffee with this.  States that this time he feels fine.  States he drank 2 bottles of water and since drinking more water he feels well.  Denies any current chest pain, shortness of breath, abdominal pain, rash, difficulty breathing or swallowing, swelling sensation, fevers, cough or recent sickness.  States he has not yet taken his daily blood pressure medication today as he forgot to take this this morning.  Patient also request to have COVID-19 testing completed while here in urgent care today, denies symptoms.  No known sick contacts.  States feels well at this time.  Of note, Meticore weight loss supplement ingredients include mango, seaweed extract, turmeric, ginger and antioxidants.  Glean Hess, MD: PCP    Past Medical History:  Diagnosis Date  . Hepatitis C    recieving po meds  . Hypertension   . Impotence     Patient Active Problem List   Diagnosis Date Noted  . Contusion of right lower leg 10/26/2018  . Colon cancer screening   . Benign neoplasm of transverse colon   . Positive hepatitis C antibody test 10/24/2017  . Hypertension 07/08/2016  . Obesity, morbid ([place]) 06/26/2016    Past Surgical History:  Procedure Laterality Date  . CLOSED MANIPULATION SHOULDER     dislocated shoulder - age 53  . COLONOSCOPY WITH PROPOFOL N/A 03/18/2018   Procedure: COLONOSCOPY WITH PROPOFOL;  Surgeon: Lucilla Lame, MD;  Location: [facility];  Service: Endoscopy;  Laterality: N/A;  . POLYPECTOMY N/A 03/18/2018   Procedure: POLYPECTOMY;  Surgeon: Lucilla Lame, MD;  Location: [facility];  Service: Endoscopy;  Laterality: N/A;     No current facility-administered medications for this encounter.   Current Outpatient Medications:  .  amLODipine (NORVASC) 10 MG tablet, Take 1 tablet (10 mg total) by mouth daily., Disp: 90 tablet, Rfl: 1 .  VIAGRA 100 MG tablet, TAKE 0.5-1 TABLETS (50-100 MG TOTAL) BY MOUTH DAILY AS NEEDED FOR ERECTILE DYSFUNCTION., Disp: 2 tablet, Rfl: 29  Allergies Patient has no known allergies.  Family History  Problem Relation Age of Onset  . Diabetes Father     Social History Social History   Tobacco Use  . Smoking status: Never Smoker  . Smokeless tobacco: Never Used  Substance Use Topics  . Alcohol use: Yes    Alcohol/week: 6.0 standard drinks    Types: 6 Cans of beer per week    Comment: on weekends  . Drug use: No    Review of Systems Constitutional: No fever ENT: No sore throat. Cardiovascular: Denies chest pain. Respiratory: Denies shortness of breath. Gastrointestinal: No abdominal pain.  Some nausea earlier, now resolved, no vomiting.  No diarrhea.  No constipation. Genitourinary: Negative for dysuria. Musculoskeletal: Negative for back pain. Skin: Negative for rash. Neurological: Negative for headaches, focal weakness or numbness.   ____________________________________________   PHYSICAL EXAM:  VITAL SIGNS: ED Triage Vitals [05/16/19 1518]  Enc Vitals Group  BP (!) 160/127     Pulse Rate (!) 105 Recheck 86     Resp 18     Temp 98.2 F (36.8 C)     Temp Source Oral     SpO2 100 %     Weight 276 lb (125.2 kg)     Height 5\' 11"  (1.803 m)     Head Circumference      Peak Flow      Pain Score 0     Pain Loc      Pain Edu?      Excl. in [place]?    Vitals:   05/16/19 1518 05/16/19 1543  BP: (!) 160/127 (!) 149/107   Pulse: (!) 105   Resp: 18   Temp: 98.2 F (36.8 C)   TempSrc: Oral   SpO2: 100%   Weight: 276 lb (125.2 kg)   Height: 5\' 11"  (1.803 m)     Constitutional: Alert and oriented. Well appearing and in no acute distress. Eyes: Conjunctivae are normal. ENT      Head: Normocephalic and atraumatic. Cardiovascular: Normal rate, regular rhythm. Grossly normal heart sounds.  Good peripheral circulation. Respiratory: Normal respiratory effort without tachypnea nor retractions. Breath sounds are clear and equal bilaterally. No wheezes, rales, rhonchi. Gastrointestinal: Soft and nontender. No CVA tenderness. Musculoskeletal: steady gait.  Neurologic:  Normal speech and language. Speech is normal. No gait instability.  Skin:  Skin is warm, dry and intact. No rash noted. Psychiatric: Mood and affect are normal. Speech and behavior are normal. Patient exhibits appropriate insight and judgment   ___________________________________________   LABS (all labs ordered are listed, but only abnormal results are displayed)  Labs Reviewed  SARS CORONAVIRUS 2 (TAT 6-24 HRS)  NOVEL CORONAVIRUS, NAA (HOSP ORDER, SEND-OUT TO REF LAB; TAT 18-24 HRS)     PROCEDURES Procedures   INITIAL IMPRESSION / ASSESSMENT AND PLAN / ED COURSE  Pertinent labs & imaging results that were available during my care of the patient were reviewed by me and considered in my medical decision making (see chart for details).  Very well-appearing patient.  Laughing and joking in room.  Denies pain or complaints at this time.  Accidentally took 2 Meticore supplements this morning instead of 1.  Patient states now feels well.  Nursing staff contacted poison control to verify no interaction recommended, no interaction recommended from poison control.  Counseled strongly with patient to be cautious in medication medications to avoid errors.  Counseled to not take supplement for 3 days and caution with resuming.  Patient blood pressure  elevated in urgent care, recheck had improved, counseled to take his medication once he returns home.  COVID-19 testing also completed and advice given.  Supportive care and close monitoring.  Discussed very strict follow-up and return parameters.  Discussed follow up with Primary care physician this week. Discussed follow up and return parameters including no resolution or any worsening concerns. Patient verbalized understanding and agreed to plan.   ____________________________________________   FINAL CLINICAL IMPRESSION(S) / ED DIAGNOSES  Final diagnoses:  Accidental medication error, initial encounter  Encounter for laboratory testing for COVID-19 virus  Elevated blood pressure reading     ED Discharge Orders    None       Note: This dictation was prepared with Dragon dictation along with smaller phrase technology. Any transcriptional errors that result from this process are unintentional.         Marylene Land, NP 05/16/19 [phone]

## 2019-05-16 NOTE — ED Triage Notes (Signed)
Pt reports that he accidentally took 2 tablet of Meticore-weight loss supplement. Instructions are to take one a day. States he took these at approx 1030AM, states he initially felt "woozy" but that feeling has "eased off now". Pt just wants to get checked out. Pt alert and oriented X4, cooperative, RR even and unlabored, color WNL. Pt in NAD.

## 2019-05-18 LAB — NOVEL CORONAVIRUS, NAA (HOSP ORDER, SEND-OUT TO REF LAB; TAT 18-24 HRS): SARS-CoV-2, NAA: NOT DETECTED

## 2019-09-20 ENCOUNTER — Telehealth: Payer: Self-pay | Admitting: Internal Medicine

## 2019-09-20 ENCOUNTER — Other Ambulatory Visit: Payer: Self-pay

## 2019-09-20 DIAGNOSIS — I10 Essential (primary) hypertension: Secondary | ICD-10-CM

## 2019-09-20 MED ORDER — AMLODIPINE BESYLATE 10 MG PO TABS: 10.0000 mg | ORAL_TABLET | Freq: Every day | ORAL | 0 refills | Status: DC

## 2019-09-20 NOTE — Telephone Encounter (Signed)
Patient is asking for amLODipine (NORVASC) 10 MG  To be sent to the [facility] in [place]. He mention that he tried to have [facility] medicine to [place] but he said they said to contact your pcp

## 2019-10-27 ENCOUNTER — Ambulatory Visit (INDEPENDENT_AMBULATORY_CARE_PROVIDER_SITE_OTHER): Payer: 59 | Admitting: Internal Medicine

## 2019-10-27 ENCOUNTER — Encounter: Payer: Self-pay | Admitting: Internal Medicine

## 2019-10-27 ENCOUNTER — Other Ambulatory Visit: Payer: Self-pay

## 2019-10-27 ENCOUNTER — Other Ambulatory Visit
Admission: RE | Admit: 2019-10-27 | Discharge: 2019-10-27 | Disposition: A | Discharge status: Home / Self Care | Payer: 59 | Attending: Internal Medicine | Admitting: Internal Medicine

## 2019-10-27 VITALS — BP 134/82 | HR 69 | Temp 97.7°F | Ht 71.0 in | Wt 296.0 lb

## 2019-10-27 DIAGNOSIS — Z0000 Encounter for general adult medical examination without abnormal findings: Secondary | ICD-10-CM

## 2019-10-27 DIAGNOSIS — Z6841 Body Mass Index (BMI) 40.0 and over, adult: Secondary | ICD-10-CM

## 2019-10-27 DIAGNOSIS — I10 Essential (primary) hypertension: Secondary | ICD-10-CM | POA: Diagnosis not present

## 2019-10-27 DIAGNOSIS — Z125 Encounter for screening for malignant neoplasm of prostate: Secondary | ICD-10-CM

## 2019-10-27 DIAGNOSIS — R7689 Other specified abnormal immunological findings in serum: Secondary | ICD-10-CM

## 2019-10-27 DIAGNOSIS — R768 Other specified abnormal immunological findings in serum: Secondary | ICD-10-CM

## 2019-10-27 LAB — COMPREHENSIVE METABOLIC PANEL
ALT: 49 U/L — ABNORMAL HIGH (ref 0–44)
AST: 32 U/L (ref 15–41)
Albumin: 4.2 g/dL (ref 3.5–5.0)
Alkaline Phosphatase: 88 U/L (ref 38–126)
Anion gap: 10 (ref 5–15)
BUN: 12 mg/dL (ref 6–20)
CO2: 25 mmol/L (ref 22–32)
Calcium: 9.1 mg/dL (ref 8.9–10.3)
Chloride: 103 mmol/L (ref 98–111)
Creatinine, Ser: 0.8 mg/dL (ref 0.61–1.24)
GFR calc Af Amer: >60 mL/min (ref >60)
GFR calc non Af Amer: >60 mL/min (ref >60)
Glucose, Bld: 105 mg/dL — ABNORMAL HIGH (ref 70–99)
Potassium: 4.1 mmol/L (ref 3.5–5.1)
Sodium: 138 mmol/L (ref 135–145)
Total Bilirubin: 0.8 mg/dL (ref 0.3–1.2)
Total Protein: 7.7 g/dL (ref 6.5–8.1)

## 2019-10-27 LAB — CBC WITH DIFFERENTIAL/PLATELET
Abs Immature Granulocytes: 0.03 10*3/uL (ref 0.00–0.07)
Basophils Absolute: 0 10*3/uL (ref 0.0–0.1)
Basophils Relative: 1 %
Eosinophils Absolute: 0.2 10*3/uL (ref 0.0–0.5)
Eosinophils Relative: 4 %
HCT: 46 % (ref 39.0–52.0)
Hemoglobin: 15.8 g/dL (ref 13.0–17.0)
Immature Granulocytes: 1 %
Lymphocytes Relative: 35 %
Lymphs Abs: 1.4 10*3/uL (ref 0.7–4.0)
MCH: 30 pg (ref 26.0–34.0)
MCHC: 34.3 g/dL (ref 30.0–36.0)
MCV: 87.5 fL (ref 80.0–100.0)
Monocytes Absolute: 0.6 10*3/uL (ref 0.1–1.0)
Monocytes Relative: 14 %
Neutro Abs: 1.8 10*3/uL (ref 1.7–7.7)
Neutrophils Relative %: 45 %
Platelets: 223 10*3/uL (ref 150–400)
RBC: 5.26 MIL/uL (ref 4.22–5.81)
RDW: 11.8 % (ref 11.5–15.5)
WBC: 4 10*3/uL (ref 4.0–10.5)
nRBC: 0 % (ref 0.0–0.2)

## 2019-10-27 LAB — POCT URINALYSIS DIPSTICK
Bilirubin, UA: NEGATIVE
Glucose, UA: NEGATIVE
Ketones, UA: NEGATIVE
Leukocytes, UA: NEGATIVE
Nitrite, UA: NEGATIVE
Protein, UA: NEGATIVE
Spec Grav, UA: 1.015 (ref 1.010–1.025)
Urobilinogen, UA: 0.2 E.U./dL
pH, UA: 6 (ref 5.0–8.0)

## 2019-10-27 LAB — PSA: Prostatic Specific Antigen: 0.18 ng/mL (ref 0.00–4.00)

## 2019-10-27 LAB — LIPID PANEL
Cholesterol: 192 mg/dL (ref 0–200)
HDL: 53 mg/dL (ref >40)
LDL Cholesterol: 107 mg/dL — ABNORMAL HIGH (ref 0–99)
Total CHOL/HDL Ratio: 3.6 RATIO
Triglycerides: 162 mg/dL — ABNORMAL HIGH (ref <150)
VLDL: 32 mg/dL (ref 0–40)

## 2019-10-27 NOTE — Patient Instructions (Signed)
[place] Vaccine # [phone]

## 2019-10-27 NOTE — Progress Notes (Signed)
Date:  10/27/2019   Name:  Andrew Hodge   DOB:  1961/04/13   MRN:  [id number]   Chief Complaint: Annual Exam (Did not get flu shot this season. Wants added to covid wait list. ) Andrew Hodge is a 59 y.o. male who presents today for his Complete Annual Exam. He feels well. He reports exercising - planning to start. He reports he is sleeping well.   Colonoscopy 2019 Immunization History  Administered Date(s) Administered  . Tdap 12/12/2015    Hypertension This is a chronic problem. The problem is controlled. Pertinent negatives include no chest pain, headaches, palpitations or shortness of breath. Past treatments include calcium channel blockers.  HCV + noted 05/2018.  No follow up labs were done last November after he completed treatment.  Lab Results  Component Value Date   CREATININE 0.75 (L) 10/26/2018   BUN 13 10/26/2018   NA 141 10/26/2018   K 4.3 10/26/2018   CL 103 10/26/2018   CO2 23 10/26/2018   Lab Results  Component Value Date   CHOL 193 10/26/2018   HDL 52 10/26/2018   LDLCALC 113 (H) 10/26/2018   TRIG 141 10/26/2018   CHOLHDL 3.7 10/26/2018   Lab Results  Component Value Date   TSH 4.020 10/26/2018   No results found for: HGBA1C Lab Results  Component Value Date   WBC 4.2 10/26/2018   HGB 14.5 10/26/2018   HCT 42.8 10/26/2018   MCV 86 10/26/2018   PLT 271 10/26/2018   Lab Results  Component Value Date   ALT 35 10/26/2018   AST 19 10/26/2018   ALKPHOS 118 (H) 10/26/2018   BILITOT 0.5 10/26/2018     Review of Systems  Constitutional: Negative for appetite change, chills, diaphoresis, fatigue and unexpected weight change.  HENT: Negative for hearing loss, tinnitus, trouble swallowing and voice change.   Eyes: Negative for visual disturbance.  Respiratory: Negative for choking, shortness of breath and wheezing.   Cardiovascular: Negative for chest pain, palpitations and leg swelling.  Gastrointestinal: Negative for abdominal pain, blood  in stool, constipation and diarrhea.  Genitourinary: Negative for difficulty urinating, dysuria and frequency.       Nocturia x 5  Musculoskeletal: Negative for arthralgias, back pain and myalgias.  Skin: Negative for color change and rash.  Neurological: Negative for dizziness, syncope and headaches.  Hematological: Negative for adenopathy.  Psychiatric/Behavioral: Negative for dysphoric mood and sleep disturbance. The patient is not nervous/anxious.     Patient Active Problem List   Diagnosis Date Noted  . Benign neoplasm of transverse colon   . Positive hepatitis C antibody test 10/24/2017  . Hypertension 07/08/2016  . Obesity, morbid ([place]) 06/26/2016    No Known Allergies  Past Surgical History:  Procedure Laterality Date  . CLOSED MANIPULATION SHOULDER     dislocated shoulder - age 16  . COLONOSCOPY WITH PROPOFOL N/A 03/18/2018   Procedure: COLONOSCOPY WITH PROPOFOL;  Surgeon: Lucilla Lame, MD;  Location: [place];  Service: Endoscopy;  Laterality: N/A;  . POLYPECTOMY N/A 03/18/2018   Procedure: POLYPECTOMY;  Surgeon: Lucilla Lame, MD;  Location: [place];  Service: Endoscopy;  Laterality: N/A;    Social History   Tobacco Use  . Smoking status: Never Smoker  . Smokeless tobacco: Never Used  Substance Use Topics  . Alcohol use: Yes    Alcohol/week: 6.0 standard drinks    Types: 6 Cans of beer per week    Comment: on weekends  .  Drug use: No     Medication list has been reviewed and updated.  Current Meds  Medication Sig  . amLODipine (NORVASC) 10 MG tablet Take 1 tablet (10 mg total) by mouth daily.  Marland Kitchen VIAGRA 100 MG tablet TAKE 0.5-1 TABLETS (50-100 MG TOTAL) BY MOUTH DAILY AS NEEDED FOR ERECTILE DYSFUNCTION.    PHQ 2/9 Scores 10/27/2019 10/26/2018 10/22/2017 07/01/2016  PHQ - 2 Score 0 0 0 0  PHQ- 9 Score 0 - 0 -    BP Readings from Last 3 Encounters:  10/27/19 134/82  05/16/19 (!) 149/107  10/26/18 122/82    Physical Exam Vitals and  nursing note reviewed.  Constitutional:      Appearance: Normal appearance. He is well-developed.  HENT:     Head: Normocephalic.     Right Ear: Tympanic membrane, ear canal and external ear normal.     Left Ear: Tympanic membrane, ear canal and external ear normal.     Nose: Nose normal.     Mouth/Throat:     Pharynx: Uvula midline.  Eyes:     Conjunctiva/sclera: Conjunctivae normal.     Pupils: Pupils are equal, round, and reactive to light.  Neck:     Thyroid: No thyromegaly.     Vascular: No carotid bruit.  Cardiovascular:     Rate and Rhythm: Normal rate and regular rhythm.     Pulses: Normal pulses.     Heart sounds: Normal heart sounds.  Pulmonary:     Effort: Pulmonary effort is normal.     Breath sounds: Normal breath sounds. No wheezing.  Chest:     Breasts:        Right: No mass.        Left: No mass.  Abdominal:     General: Bowel sounds are normal.     Palpations: Abdomen is soft.     Tenderness: There is no abdominal tenderness.  Musculoskeletal:        General: Normal range of motion.     Cervical back: Normal range of motion and neck supple.     Right lower leg: No edema.     Left lower leg: No edema.  Lymphadenopathy:     Cervical: No cervical adenopathy.  Skin:    General: Skin is warm and dry.     Capillary Refill: Capillary refill takes less than 2 seconds.  Neurological:     General: No focal deficit present.     Mental Status: He is alert and oriented to person, place, and time.     Deep Tendon Reflexes: Reflexes are normal and symmetric.  Psychiatric:        Attention and Perception: Attention normal.        Mood and Affect: Mood normal.        Speech: Speech normal.        Behavior: Behavior normal.     Wt Readings from Last 3 Encounters:  10/27/19 296 lb (134.3 kg)  05/16/19 276 lb (125.2 kg)  10/26/18 279 lb (126.6 kg)    BP 134/82   Pulse 69   Temp 97.7 F (36.5 C) (Temporal)   Ht 5\' 11"  (1.803 m)   Wt 296 lb (134.3 kg)    SpO2 95%   BMI 41.28 kg/m   Assessment and Plan: 1. Annual physical exam Normal exam except for weight Pt encouraged to improve diet and begin regular exercise - Lipid panel - POCT urinalysis dipstick  2. Essential hypertension Clinically stable exam with well  controlled BP on amlodipine. Tolerating medications without side effects at this time. Pt to continue current regimen and low sodium diet; benefits of regular exercise as able discussed. - CBC with Differential/Platelet - Comprehensive metabolic panel  3. Positive hepatitis C antibody test Last RNA was still slightly positive after completing antiviral treatment Review of chart - no indication that GI has seen the lab or instructed pt on additional testing - HCV RNA quant  4. Prostate cancer screening DRE deferred - PSA  5. BMI 40.0-44.9, adult ([place]) Diet and exercise discussed   Partially dictated using Editor, commissioning. Any errors are unintentional.  Halina Maidens, MD [facility]  10/27/2019

## 2019-10-28 ENCOUNTER — Ambulatory Visit: Payer: 59 | Attending: Internal Medicine

## 2019-10-28 DIAGNOSIS — Z23 Encounter for immunization: Secondary | ICD-10-CM

## 2019-10-28 LAB — HCV RNA QUANT
HCV Quantitative Log: 6.63 log10 IU/mL (ref >1.70)
HCV Quantitative: 4270000 IU/mL (ref >50)

## 2019-10-28 NOTE — Progress Notes (Signed)
   Covid-19 Vaccination Clinic  Name:  Andrew Hodge    MRN: [id number] DOB: Feb 03, 1961  10/28/2019  [name] was observed post Covid-19 immunization for 15 minutes without incident. He was provided with Vaccine Information Sheet and instruction to access the V-Safe system.   [name] was instructed to call 911 with any severe reactions post vaccine: Marland Kitchen Difficulty breathing  . Swelling of face and throat  . A fast heartbeat  . A bad rash all over body  . Dizziness and weakness   Immunizations Administered    Name Date Dose VIS Date Route   Pfizer COVID-19 Vaccine 10/28/2019  8:40 AM 0.3 mL 07/01/2019 Intramuscular   Manufacturer: [place]   Lot: [id number]   [place]: [id number]

## 2019-10-31 ENCOUNTER — Telehealth: Payer: Self-pay

## 2019-10-31 NOTE — Telephone Encounter (Signed)
-----   Message from Clista Bernhardt, [place] sent at 10/31/2019  2:28 PM EDT ----- Patient informed. And told him Dr Allen Norris will call him with next steps on what to do about HEP C being present still. CM

## 2019-10-31 NOTE — Telephone Encounter (Signed)
Please contact pt and schedule a follow up appt at Dr. Dorothey Baseman next available. Reason for appt is Hep C relapse

## 2019-11-01 NOTE — Telephone Encounter (Signed)
Patient scheduled for 12/27/19 @1 :[id number]

## 2019-11-22 ENCOUNTER — Ambulatory Visit: Payer: No Typology Code available for payment source | Attending: Internal Medicine

## 2019-11-22 ENCOUNTER — Other Ambulatory Visit: Payer: Self-pay | Admitting: Internal Medicine

## 2019-11-22 DIAGNOSIS — Z23 Encounter for immunization: Secondary | ICD-10-CM

## 2019-11-22 MED ORDER — SILDENAFIL CITRATE 100 MG PO TABS: ORAL_TABLET | ORAL | 29 refills | Status: AC

## 2019-11-22 NOTE — Telephone Encounter (Signed)
VIAGRA 100 MG tablet     Patient is requesting refill.    Pharmacy:  [facility] [address] [place] Phone:  [phone]  Fax:  [phone]

## 2019-11-22 NOTE — Progress Notes (Signed)
   Covid-19 Vaccination Clinic  Name:  Andrew Hodge    MRN: [id number] DOB: Oct 05, 1960  11/22/2019  Mr. Andrew Hodge was observed post Covid-19 immunization for 15 minutes without incident. He was provided with Vaccine Information Sheet and instruction to access the V-Safe system.   Mr. Andrew Hodge was instructed to call 911 with any severe reactions post vaccine: Marland Kitchen Difficulty breathing  . Swelling of face and throat  . A fast heartbeat  . A bad rash all over body  . Dizziness and weakness   Immunizations Administered    Name Date Dose VIS Date Route   Pfizer COVID-19 Vaccine 11/22/2019 10:19 AM 0.3 mL 09/14/2018 Intramuscular   Manufacturer: [place]   Lot: [id number]   [place]: [id number]

## 2019-12-27 ENCOUNTER — Ambulatory Visit: Payer: 59 | Admitting: Gastroenterology

## 2019-12-27 ENCOUNTER — Encounter: Payer: Self-pay | Admitting: Gastroenterology

## 2020-10-19 ENCOUNTER — Other Ambulatory Visit: Payer: Self-pay

## 2020-10-19 ENCOUNTER — Encounter: Payer: Self-pay | Admitting: Emergency Medicine

## 2020-10-19 ENCOUNTER — Ambulatory Visit
Admission: EM | Admit: 2020-10-19 | Discharge: 2020-10-19 | Disposition: A | Discharge status: Home / Self Care | Payer: 59 | Attending: Sports Medicine | Admitting: Sports Medicine

## 2020-10-19 DIAGNOSIS — R300 Dysuria: Secondary | ICD-10-CM | POA: Diagnosis present

## 2020-10-19 DIAGNOSIS — R350 Frequency of micturition: Secondary | ICD-10-CM | POA: Insufficient documentation

## 2020-10-19 DIAGNOSIS — R3915 Urgency of urination: Secondary | ICD-10-CM | POA: Insufficient documentation

## 2020-10-19 LAB — URINALYSIS, COMPLETE (UACMP) WITH MICROSCOPIC
Bacteria, UA: NONE SEEN
Glucose, UA: NEGATIVE mg/dL
Hgb urine dipstick: NEGATIVE
Nitrite: NEGATIVE
Protein, ur: NEGATIVE mg/dL
Specific Gravity, Urine: >1.03 — ABNORMAL HIGH (ref 1.005–1.030)
pH: 5.5 (ref 5.0–8.0)

## 2020-10-19 MED ORDER — PHENAZOPYRIDINE HCL 200 MG PO TABS: 200.0000 mg | ORAL_TABLET | Freq: Three times a day (TID) | ORAL | 0 refills | Status: DC

## 2020-10-19 NOTE — ED Triage Notes (Signed)
Patient c/o burning when urinating that started a week ago.  Patient denies fevers.
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
## 2020-10-19 NOTE — Discharge Instructions (Signed)
Your urine does not show a definitive urinary tract infection.  I sent off the culture.  Someone will contact you if you require an antibiotic. I sent in a medication to your pharmacy for the burning with urination.  Please take it as directed. Your blood pressure is elevated and I encourage you to take your blood pressure medication.  You should follow-up with your primary care provider for a blood pressure recheck to see whether or not you need an increase in your medications. I have provided some educational handouts.  Please read them. Please continue to flush her system with plenty of fluids.  I hope you get to feeling better, Dr. Drema Dallas

## 2020-10-25 LAB — URINE CULTURE: Culture: NO GROWTH

## 2020-10-30 ENCOUNTER — Encounter: Payer: Self-pay | Admitting: Internal Medicine

## 2020-10-30 ENCOUNTER — Encounter: Payer: Self-pay | Admitting: *Deleted

## 2020-10-30 ENCOUNTER — Other Ambulatory Visit: Payer: Self-pay

## 2020-10-30 ENCOUNTER — Ambulatory Visit (INDEPENDENT_AMBULATORY_CARE_PROVIDER_SITE_OTHER): Payer: 59 | Admitting: Internal Medicine

## 2020-10-30 VITALS — BP 138/86 | HR 79 | Temp 97.8°F | Ht 71.0 in | Wt 310.0 lb

## 2020-10-30 DIAGNOSIS — I10 Essential (primary) hypertension: Secondary | ICD-10-CM

## 2020-10-30 DIAGNOSIS — B182 Chronic viral hepatitis C: Secondary | ICD-10-CM

## 2020-10-30 DIAGNOSIS — Z0000 Encounter for general adult medical examination without abnormal findings: Secondary | ICD-10-CM

## 2020-10-30 DIAGNOSIS — Z6841 Body Mass Index (BMI) 40.0 and over, adult: Secondary | ICD-10-CM | POA: Diagnosis not present

## 2020-10-30 DIAGNOSIS — Z125 Encounter for screening for malignant neoplasm of prostate: Secondary | ICD-10-CM

## 2020-10-30 LAB — POCT URINALYSIS DIPSTICK
Bilirubin, UA: NEGATIVE
Blood, UA: NEGATIVE
Glucose, UA: NEGATIVE
Ketones, UA: NEGATIVE
Leukocytes, UA: NEGATIVE
Nitrite, UA: NEGATIVE
Protein, UA: NEGATIVE
Spec Grav, UA: 1.015 (ref 1.010–1.025)
Urobilinogen, UA: 0.2 E.U./dL
pH, UA: 6.5 (ref 5.0–8.0)

## 2020-10-30 MED ORDER — AMLODIPINE BESYLATE 10 MG PO TABS: 10.0000 mg | ORAL_TABLET | Freq: Every day | ORAL | 3 refills | Status: DC

## 2020-10-30 NOTE — Patient Instructions (Signed)
Call us and leave a message in 1-2 weeks to find out when your appointment with the gastroenterologist regarding Hep C is.

## 2020-10-30 NOTE — Progress Notes (Signed)
Date:  10/30/2020   Name:  Andrew Hodge   DOB:  Apr 27, 1961   MRN:  [id number]   Chief Complaint: Annual Exam and Hypertension  Andrew Hodge is a 59 y.o. male who presents today for his Complete Annual Exam. He feels well. He reports exercising none. He reports he is sleeping well.   Colonoscopy: 02/2018  Immunization History  Administered Date(s) Administered  . PFIZER(Purple Top)SARS-COV-2 Vaccination 10/28/2019, 11/22/2019  . Tdap 12/12/2015    Hypertension This is a chronic problem. The problem is controlled. Pertinent negatives include no chest pain, headaches, palpitations or shortness of breath. Past treatments include calcium channel blockers. The current treatment provides moderate improvement.   Hepatitis C - last year his Hep C was still present.  He was referred to GI but was a no show for the appointment with Dr. Allen Norris.  Last [place] in 2019 showed fibrosis score F2+F3.  Lab Results  Component Value Date   HAV Negative 12/18/2017    Lab Results  Component Value Date   CREATININE 0.80 10/27/2019   BUN 12 10/27/2019   NA 138 10/27/2019   K 4.1 10/27/2019   CL 103 10/27/2019   CO2 25 10/27/2019   Lab Results  Component Value Date   CHOL 192 10/27/2019   HDL 53 10/27/2019   LDLCALC 107 (H) 10/27/2019   TRIG 162 (H) 10/27/2019   CHOLHDL 3.6 10/27/2019   Lab Results  Component Value Date   TSH 4.020 10/26/2018   No results found for: HGBA1C Lab Results  Component Value Date   WBC 4.0 10/27/2019   HGB 15.8 10/27/2019   HCT 46.0 10/27/2019   MCV 87.5 10/27/2019   PLT 223 10/27/2019   Lab Results  Component Value Date   ALT 49 (H) 10/27/2019   AST 32 10/27/2019   ALKPHOS 88 10/27/2019   BILITOT 0.8 10/27/2019     Review of Systems  Constitutional: Negative for appetite change, chills, diaphoresis, fatigue and unexpected weight change.  HENT: Negative for hearing loss, tinnitus, trouble swallowing and voice change.   Eyes: Negative for visual  disturbance.  Respiratory: Negative for choking, shortness of breath and wheezing.   Cardiovascular: Negative for chest pain, palpitations and leg swelling.  Gastrointestinal: Negative for abdominal pain, blood in stool, constipation and diarrhea.  Genitourinary: Negative for difficulty urinating, dysuria, frequency, hematuria and urgency.  Musculoskeletal: Negative for arthralgias, back pain and myalgias.  Skin: Negative for color change and rash.  Neurological: Negative for dizziness, syncope and headaches.  Hematological: Negative for adenopathy.  Psychiatric/Behavioral: Negative for dysphoric mood and sleep disturbance.    Patient Active Problem List   Diagnosis Date Noted  . Benign neoplasm of transverse colon   . Hepatitis C, chronic ([place]) 10/24/2017  . Hypertension 07/08/2016  . Obesity, morbid ([place]) 06/26/2016    No Known Allergies  Past Surgical History:  Procedure Laterality Date  . CLOSED MANIPULATION SHOULDER     dislocated shoulder - age 40  . COLONOSCOPY WITH PROPOFOL N/A 03/18/2018   Procedure: COLONOSCOPY WITH PROPOFOL;  Surgeon: Lucilla Lame, MD;  Location: [facility];  Service: Endoscopy;  Laterality: N/A;  . POLYPECTOMY N/A 03/18/2018   Procedure: POLYPECTOMY;  Surgeon: Lucilla Lame, MD;  Location: [facility];  Service: Endoscopy;  Laterality: N/A;    Social History   Tobacco Use  . Smoking status: Never Smoker  . Smokeless tobacco: Never Used  Vaping Use  . Vaping Use: Never used  Substance Use Topics  .  Alcohol use: Yes    Alcohol/week: 6.0 standard drinks    Types: 6 Cans of beer per week    Comment: on weekends  . Drug use: No     Medication list has been reviewed and updated.  Current Meds  Medication Sig  . sildenafil (VIAGRA) 100 MG tablet TAKE 0.5-1 TABLETS (50-100 MG TOTAL) BY MOUTH DAILY AS NEEDED FOR ERECTILE DYSFUNCTION.  . [DISCONTINUED] amLODipine (NORVASC) 10 MG tablet Take 1 tablet (10 mg total) by mouth daily.     PHQ 2/9 Scores 10/30/2020 10/27/2019 10/26/2018 10/22/2017  PHQ - 2 Score 0 0 0 0  PHQ- 9 Score 0 0 - 0    GAD 7 : Generalized Anxiety Score 10/30/2020 10/27/2019  Nervous, Anxious, on Edge 0 0  Control/stop worrying 0 0  Worry too much - different things 0 0  Trouble relaxing 0 0  Restless 0 0  Easily annoyed or irritable 0 0  Afraid - awful might happen 0 0  Total GAD 7 Score 0 0  Anxiety Difficulty - Not difficult at all    BP Readings from Last 3 Encounters:  10/30/20 (!) 138/92  10/19/20 (S) (!) 182/106  10/27/19 134/82    Physical Exam Vitals and nursing note reviewed.  Constitutional:      Appearance: Normal appearance. He is well-developed.  HENT:     Head: Normocephalic.     Right Ear: Tympanic membrane, ear canal and external ear normal.     Left Ear: Tympanic membrane, ear canal and external ear normal.     Nose: Nose normal.  Eyes:     Conjunctiva/sclera: Conjunctivae normal.     Pupils: Pupils are equal, round, and reactive to light.  Neck:     Thyroid: No thyromegaly.     Vascular: No carotid bruit.  Cardiovascular:     Rate and Rhythm: Normal rate and regular rhythm.     Pulses: Normal pulses.     Heart sounds: Normal heart sounds.  Pulmonary:     Effort: Pulmonary effort is normal.     Breath sounds: Normal breath sounds. No wheezing.  Chest:  Breasts:     Right: No mass.     Left: No mass.    Abdominal:     General: Bowel sounds are normal.     Palpations: Abdomen is soft.     Tenderness: There is no abdominal tenderness.  Musculoskeletal:        General: Normal range of motion.     Cervical back: Normal range of motion and neck supple.     Right lower leg: No edema.     Left lower leg: No edema.  Lymphadenopathy:     Cervical: No cervical adenopathy.  Skin:    General: Skin is warm and dry.  Neurological:     Mental Status: He is alert and oriented to person, place, and time.     Deep Tendon Reflexes: Reflexes are normal and symmetric.   Psychiatric:        Attention and Perception: Attention normal.        Mood and Affect: Mood normal.        Thought Content: Thought content normal.     Wt Readings from Last 3 Encounters:  10/30/20 (!) 310 lb (140.6 kg)  10/19/20 300 lb (136.1 kg)  10/27/19 296 lb (134.3 kg)    BP (!) 138/92   Pulse 79   Temp 97.8 F (36.6 C) (Oral)   Ht 5\' 11"  (1.803  m)   Wt (!) 310 lb (140.6 kg)   SpO2 95%   BMI 43.24 kg/m   Assessment and Plan: 1. Annual physical exam Exam is normal except for weight. Encourage regular exercise and appropriate dietary changes. - Lipid panel  2. Prostate cancer screening DRE deferred - PSA  3. Primary hypertension Clinically stable exam with well controlled BP. Tolerating medications without side effects at this time. Pt to continue current regimen and low sodium diet; benefits of regular exercise as able discussed. - CBC with Differential/Platelet - POCT urinalysis dipstick  4. Chronic hepatitis C without hepatic coma ([place]) Will get labs and refer.  It is unclear why he missed the appt last year although he states that he was not aware of it. - Comprehensive metabolic panel - HCV RNA quant  5. BMI 40.0-44.9, adult ([place]) Work on diet and exercise   Partially dictated using Editor, commissioning. Any errors are unintentional.  Halina Maidens, MD [facility]  10/30/2020

## 2020-10-31 ENCOUNTER — Encounter: Payer: Self-pay | Admitting: *Deleted

## 2020-10-31 LAB — CBC WITH DIFFERENTIAL/PLATELET
Basophils Absolute: 0 10*3/uL (ref 0.0–0.2)
Basos: 1 %
EOS (ABSOLUTE): 0.2 10*3/uL (ref 0.0–0.4)
Eos: 4 %
Hematocrit: 46.1 % (ref 37.5–51.0)
Hemoglobin: 15.7 g/dL (ref 13.0–17.7)
Immature Grans (Abs): 0 10*3/uL (ref 0.0–0.1)
Immature Granulocytes: 1 %
Lymphocytes Absolute: 1.5 10*3/uL (ref 0.7–3.1)
Lymphs: 34 %
MCH: 30.3 pg (ref 26.6–33.0)
MCHC: 34.1 g/dL (ref 31.5–35.7)
MCV: 89 fL (ref 79–97)
Monocytes Absolute: 0.5 10*3/uL (ref 0.1–0.9)
Monocytes: 12 %
Neutrophils Absolute: 2.1 10*3/uL (ref 1.4–7.0)
Neutrophils: 48 %
Platelets: 231 10*3/uL (ref 150–450)
RBC: 5.18 x10E6/uL (ref 4.14–5.80)
RDW: 13.1 % (ref 11.6–15.4)
WBC: 4.2 10*3/uL (ref 3.4–10.8)

## 2020-10-31 LAB — COMPREHENSIVE METABOLIC PANEL
ALT: 60 IU/L — ABNORMAL HIGH (ref 0–44)
AST: 33 IU/L (ref 0–40)
Albumin/Globulin Ratio: 1.5 (ref 1.2–2.2)
Albumin: 4.4 g/dL (ref 3.8–4.9)
Alkaline Phosphatase: 95 IU/L (ref 44–121)
BUN/Creatinine Ratio: 16 (ref 9–20)
BUN: 14 mg/dL (ref 6–24)
Bilirubin Total: 0.6 mg/dL (ref 0.0–1.2)
CO2: 19 mmol/L — ABNORMAL LOW (ref 20–29)
Calcium: 9.2 mg/dL (ref 8.7–10.2)
Chloride: 104 mmol/L (ref 96–106)
Creatinine, Ser: 0.85 mg/dL (ref 0.76–1.27)
Globulin, Total: 3 g/dL (ref 1.5–4.5)
Glucose: 101 mg/dL — ABNORMAL HIGH (ref 65–99)
Potassium: 4.1 mmol/L (ref 3.5–5.2)
Sodium: 138 mmol/L (ref 134–144)
Total Protein: 7.4 g/dL (ref 6.0–8.5)
eGFR: 100 mL/min/{1.73_m2} (ref >59)

## 2020-10-31 LAB — LIPID PANEL
Chol/HDL Ratio: 4 ratio (ref 0.0–5.0)
Cholesterol, Total: 193 mg/dL (ref 100–199)
HDL: 48 mg/dL (ref >39)
LDL Chol Calc (NIH): 114 mg/dL — ABNORMAL HIGH (ref 0–99)
Triglycerides: 174 mg/dL — ABNORMAL HIGH (ref 0–149)
VLDL Cholesterol Cal: 31 mg/dL (ref 5–40)

## 2020-10-31 LAB — HCV RNA QUANT
HCV log10: 6.938 log10 IU/mL
Hepatitis C Quantitation: 8670000 IU/mL

## 2020-10-31 LAB — PSA: Prostate Specific Ag, Serum: 0.3 ng/mL (ref 0.0–4.0)

## 2020-12-25 ENCOUNTER — Other Ambulatory Visit: Payer: Self-pay

## 2020-12-25 ENCOUNTER — Ambulatory Visit: Payer: Commercial Managed Care - PPO | Admitting: Gastroenterology

## 2020-12-25 ENCOUNTER — Encounter: Payer: Self-pay | Admitting: Gastroenterology

## 2020-12-25 VITALS — BP 200/121 | HR 83 | Ht 71.0 in | Wt 312.0 lb

## 2020-12-25 DIAGNOSIS — B182 Chronic viral hepatitis C: Secondary | ICD-10-CM
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
## 2021-04-22 ENCOUNTER — Other Ambulatory Visit: Payer: Self-pay

## 2021-04-22 ENCOUNTER — Ambulatory Visit (INDEPENDENT_AMBULATORY_CARE_PROVIDER_SITE_OTHER): Payer: 59 | Admitting: Internal Medicine

## 2021-04-22 ENCOUNTER — Telehealth: Payer: Self-pay | Admitting: *Deleted

## 2021-04-22 ENCOUNTER — Encounter: Payer: Self-pay | Admitting: Internal Medicine

## 2021-04-22 VITALS — BP 170/120 | HR 68 | Temp 97.6°F | Ht 71.0 in | Wt 311.0 lb

## 2021-04-22 DIAGNOSIS — I10 Essential (primary) hypertension: Secondary | ICD-10-CM | POA: Diagnosis not present

## 2021-04-22 MED ORDER — NEBIVOLOL HCL 10 MG PO TABS: 10.0000 mg | ORAL_TABLET | Freq: Every day | ORAL | 0 refills | Status: DC

## 2021-04-22 NOTE — Progress Notes (Signed)
Date:  04/22/2021   Name:  Andrew Hodge   DOB:  1961/06/12   MRN:  [id number]   Chief Complaint: Hypertension  Hypertension This is a chronic problem. The problem is resistant. Pertinent negatives include no chest pain, headaches, palpitations or shortness of breath. Past treatments include calcium channel blockers. The current treatment provides mild improvement. Compliance problems: he uses [employer] on everythen.    Lab Results  Component Value Date   CREATININE 0.85 10/30/2020   BUN 14 10/30/2020   NA 138 10/30/2020   K 4.1 10/30/2020   CL 104 10/30/2020   CO2 19 (L) 10/30/2020   Lab Results  Component Value Date   CHOL 193 10/30/2020   HDL 48 10/30/2020   LDLCALC 114 (H) 10/30/2020   TRIG 174 (H) 10/30/2020   CHOLHDL 4.0 10/30/2020   Lab Results  Component Value Date   TSH 4.020 10/26/2018   No results found for: HGBA1C Lab Results  Component Value Date   WBC 4.2 10/30/2020   HGB 15.7 10/30/2020   HCT 46.1 10/30/2020   MCV 89 10/30/2020   PLT 231 10/30/2020   Lab Results  Component Value Date   ALT 60 (H) 10/30/2020   AST 33 10/30/2020   ALKPHOS 95 10/30/2020   BILITOT 0.6 10/30/2020     Review of Systems  Constitutional:  Negative for fatigue and unexpected weight change.  Eyes:  Negative for visual disturbance.  Respiratory:  Negative for cough, chest tightness, shortness of breath and wheezing.   Cardiovascular:  Negative for chest pain, palpitations and leg swelling.  Gastrointestinal:  Negative for abdominal pain, constipation and diarrhea.  Neurological:  Negative for dizziness, weakness, light-headedness and headaches.  Psychiatric/Behavioral:  Negative for dysphoric mood and sleep disturbance. The patient is not nervous/anxious.    Patient Active Problem List   Diagnosis Date Noted   Benign neoplasm of transverse colon    Hepatitis C, chronic ([place]) 10/24/2017   Hypertension 07/08/2016   Obesity, morbid ([place]) 06/26/2016     No Known Allergies  Past Surgical History:  Procedure Laterality Date   CLOSED MANIPULATION SHOULDER     dislocated shoulder - age 76   COLONOSCOPY WITH PROPOFOL N/A 03/18/2018   Procedure: COLONOSCOPY WITH PROPOFOL;  Surgeon: Lucilla Lame, MD;  Location: [facility];  Service: Endoscopy;  Laterality: N/A;   POLYPECTOMY N/A 03/18/2018   Procedure: POLYPECTOMY;  Surgeon: Lucilla Lame, MD;  Location: [facility];  Service: Endoscopy;  Laterality: N/A;    Social History   Tobacco Use   Smoking status: Never   Smokeless tobacco: Never  Vaping Use   Vaping Use: Never used  Substance Use Topics   Alcohol use: Yes    Alcohol/week: 6.0 standard drinks    Types: 6 Cans of beer per week    Comment: on weekends   Drug use: No     Medication list has been reviewed and updated.  Current Meds  Medication Sig   amLODipine (NORVASC) 10 MG tablet Take 1 tablet (10 mg total) by mouth daily.   sildenafil (VIAGRA) 100 MG tablet TAKE 0.5-1 TABLETS (50-100 MG TOTAL) BY MOUTH DAILY AS NEEDED FOR ERECTILE DYSFUNCTION.    PHQ 2/9 Scores 10/30/2020 10/27/2019 10/26/2018 10/22/2017  PHQ - 2 Score 0 0 0 0  PHQ- 9 Score 0 0 - 0    GAD 7 : Generalized Anxiety Score 10/30/2020 10/27/2019  Nervous, Anxious, on Edge 0 0  Control/stop worrying 0 0  Worry too much - different things 0 0  Trouble relaxing 0 0  Restless 0 0  Easily annoyed or irritable 0 0  Afraid - awful might happen 0 0  Total GAD 7 Score 0 0  Anxiety Difficulty - Not difficult at all    BP Readings from Last 3 Encounters:  04/22/21 (!) 170/120  12/25/20 (!) 200/121  10/30/20 138/86    Physical Exam Vitals and nursing note reviewed.  Constitutional:      General: He is not in acute distress.    Appearance: He is well-developed.  HENT:     Head: Normocephalic and atraumatic.  Cardiovascular:     Rate and Rhythm: Normal rate and regular rhythm.     Heart sounds: Normal heart sounds.  Pulmonary:     Effort:  Pulmonary effort is normal. No respiratory distress.     Breath sounds: Normal breath sounds.  Musculoskeletal:     Right lower leg: No edema.     Left lower leg: No edema.  Skin:    General: Skin is warm and dry.     Findings: No rash.  Neurological:     Mental Status: He is alert and oriented to person, place, and time.  Psychiatric:        Mood and Affect: Mood normal.        Behavior: Behavior normal.    Wt Readings from Last 3 Encounters:  04/22/21 (!) 311 lb (141.1 kg)  12/25/20 (!) 312 lb (141.5 kg)  10/30/20 (!) 310 lb (140.6 kg)    BP (!) 170/120   Pulse 68   Temp 97.6 F (36.4 C) (Oral)   Ht 5\' 11"  (1.803 m)   Wt (!) 311 lb (141.1 kg)   SpO2 95%   BMI 43.38 kg/m   Assessment and Plan: 1. Primary hypertension Resistant BP on Amlodipine 10 mg. Discussed salt restriction.  Will add Bystolic. Follow up in 2 months. - nebivolol (BYSTOLIC) 10 MG tablet; Take 1 tablet (10 mg total) by mouth daily.  Dispense: 90 tablet; Refill: 0   Partially dictated using Editor, commissioning. Any errors are unintentional.  Halina Maidens, MD [facility]  04/22/2021

## 2021-04-22 NOTE — Telephone Encounter (Signed)
Patient is calling office to let PCP know that his medication is $298.00- patient advised I would call pharmacy to find out why he has such large charge. Call to pharmacy- they are running it through on [employer]-and the charge is coming back $298.00- they wonder if he has deductible.  Called patient and he states he has new insurance- advised him he needs to give that information to the pharmacy and please notify office if charge is still too high. He states he will update at pharmacy.

## 2021-06-26 ENCOUNTER — Ambulatory Visit: Payer: 59 | Admitting: Gastroenterology

## 2021-11-04 ENCOUNTER — Ambulatory Visit (INDEPENDENT_AMBULATORY_CARE_PROVIDER_SITE_OTHER): Payer: 59 | Admitting: Internal Medicine

## 2021-11-04 ENCOUNTER — Encounter: Payer: Self-pay | Admitting: Internal Medicine

## 2021-11-04 VITALS — BP 144/100 | HR 58 | Ht 71.0 in | Wt 313.0 lb

## 2021-11-04 DIAGNOSIS — Z125 Encounter for screening for malignant neoplasm of prostate: Secondary | ICD-10-CM | POA: Diagnosis not present

## 2021-11-04 DIAGNOSIS — Z131 Encounter for screening for diabetes mellitus: Secondary | ICD-10-CM

## 2021-11-04 DIAGNOSIS — Z0000 Encounter for general adult medical examination without abnormal findings: Secondary | ICD-10-CM | POA: Diagnosis not present

## 2021-11-04 DIAGNOSIS — I10 Essential (primary) hypertension: Secondary | ICD-10-CM | POA: Diagnosis not present

## 2021-11-04 DIAGNOSIS — Z6841 Body Mass Index (BMI) 40.0 and over, adult: Secondary | ICD-10-CM

## 2021-11-04 DIAGNOSIS — E782 Mixed hyperlipidemia: Secondary | ICD-10-CM

## 2021-11-04 DIAGNOSIS — B182 Chronic viral hepatitis C: Secondary | ICD-10-CM

## 2021-11-04 LAB — POCT URINALYSIS DIPSTICK
Bilirubin, UA: NEGATIVE
Blood, UA: NEGATIVE
Glucose, UA: NEGATIVE
Ketones, UA: NEGATIVE
Leukocytes, UA: NEGATIVE
Nitrite, UA: NEGATIVE
Protein, UA: NEGATIVE
Spec Grav, UA: 1.01 (ref 1.010–1.025)
Urobilinogen, UA: 0.2 E.U./dL
pH, UA: 6 (ref 5.0–8.0)

## 2021-11-04 MED ORDER — NEBIVOLOL HCL 20 MG PO TABS: 20.0000 mg | ORAL_TABLET | Freq: Every day | ORAL | 1 refills | Status: DC

## 2021-11-04 MED ORDER — AMLODIPINE BESYLATE 10 MG PO TABS: 10.0000 mg | ORAL_TABLET | Freq: Every day | ORAL | 1 refills | Status: DC

## 2021-11-04 NOTE — Progress Notes (Signed)
? ? ?Date:  11/04/2021  ? ?Name:  Andrew Hodge   DOB:  1960/12/25   MRN:  [id number] ? ? ?Chief Complaint: Annual Exam ?Andrew Hodge is a 61 y.o. male who presents today for his Complete Annual Exam. He feels well. He reports exercising none. He reports he is sleeping fairly well.  ? ?Colonoscopy: 02/2018 ? ?Immunization History  ?Administered Date(s) Administered  ? PFIZER(Purple Top)SARS-COV-2 Vaccination 10/28/2019, 11/22/2019  ? Tdap 12/12/2015  ? ?Health Maintenance Due  ?Topic Date Due  ? Zoster Vaccines- Shingrix (1 of 2) Never done  ? COVID-19 Vaccine (3 - Booster for Pfizer series) 01/17/2020  ?  ?Lab Results  ?Component Value Date  ? PSA1 0.3 10/30/2020  ? PSA1 0.2 10/26/2018  ? PSA1 0.3 10/22/2017  ? ? ?Hypertension ?This is a chronic problem. Pertinent negatives include no chest pain, headaches, palpitations or shortness of breath. Past treatments include calcium channel blockers and beta blockers. The current treatment provides moderate improvement.  ? ?Lab Results  ?Component Value Date  ? NA 138 10/30/2020  ? K 4.1 10/30/2020  ? CO2 19 (L) 10/30/2020  ? GLUCOSE 101 (H) 10/30/2020  ? BUN 14 10/30/2020  ? CREATININE 0.85 10/30/2020  ? CALCIUM 9.2 10/30/2020  ? EGFR 100 10/30/2020  ? GFRNONAA >60 10/27/2019  ? ?Lab Results  ?Component Value Date  ? CHOL 193 10/30/2020  ? HDL 48 10/30/2020  ? LDLCALC 114 (H) 10/30/2020  ? TRIG 174 (H) 10/30/2020  ? CHOLHDL 4.0 10/30/2020  ? ?Lab Results  ?Component Value Date  ? TSH 4.020 10/26/2018  ? ?No results found for: HGBA1C ?Lab Results  ?Component Value Date  ? WBC 4.2 10/30/2020  ? HGB 15.7 10/30/2020  ? HCT 46.1 10/30/2020  ? MCV 89 10/30/2020  ? PLT 231 10/30/2020  ? ?Lab Results  ?Component Value Date  ? ALT 60 (H) 10/30/2020  ? AST 33 10/30/2020  ? ALKPHOS 95 10/30/2020  ? BILITOT 0.6 10/30/2020  ? ?No results found for: 25OHVITD2, [place], VD25OH  ? ?Review of Systems  ?Constitutional:  Negative for appetite change, chills, diaphoresis, fatigue and  unexpected weight change.  ?HENT:  Negative for hearing loss, tinnitus, trouble swallowing and voice change.   ?Eyes:  Negative for visual disturbance.  ?Respiratory:  Negative for choking, shortness of breath and wheezing.   ?Cardiovascular:  Negative for chest pain, palpitations and leg swelling.  ?Gastrointestinal:  Negative for abdominal pain, blood in stool, constipation and diarrhea.  ?Genitourinary:  Negative for difficulty urinating, dysuria and frequency.  ?Musculoskeletal:  Negative for arthralgias, back pain and myalgias.  ?Skin:  Negative for color change and rash.  ?Neurological:  Negative for dizziness, syncope and headaches.  ?Hematological:  Negative for adenopathy.  ?Psychiatric/Behavioral:  Negative for dysphoric mood and sleep disturbance. The patient is not nervous/anxious.   ? ?Patient Active Problem List  ? Diagnosis Date Noted  ? Benign neoplasm of transverse colon   ? Hepatitis C, chronic ([place]) 10/24/2017  ? Hypertension 07/08/2016  ? Obesity, morbid ([place]) 06/26/2016  ? ? ?No Known Allergies ? ?Past Surgical History:  ?Procedure Laterality Date  ? CLOSED MANIPULATION SHOULDER    ? dislocated shoulder - age 4  ? COLONOSCOPY WITH PROPOFOL N/A 03/18/2018  ? Procedure: COLONOSCOPY WITH PROPOFOL;  Surgeon: Lucilla Lame, MD;  Location: [facility];  Service: Endoscopy;  Laterality: N/A;  ? POLYPECTOMY N/A 03/18/2018  ? Procedure: POLYPECTOMY;  Surgeon: Lucilla Lame, MD;  Location: [facility];  Service:  Endoscopy;  Laterality: N/A;  ? ? ?Social History  ? ?Tobacco Use  ? Smoking status: Never  ? Smokeless tobacco: Never  ?Vaping Use  ? Vaping Use: Never used  ?Substance Use Topics  ? Alcohol use: Yes  ?  Alcohol/week: 6.0 standard drinks  ?  Types: 6 Cans of beer per week  ?  Comment: on weekends  ? Drug use: No  ? ? ? ?Medication list has been reviewed and updated. ? ?Current Meds  ?Medication Sig  ? sildenafil (VIAGRA) 100 MG tablet TAKE 0.5-1 TABLETS (50-100 MG TOTAL) BY MOUTH  DAILY AS NEEDED FOR ERECTILE DYSFUNCTION.  ? [DISCONTINUED] amLODipine (NORVASC) 10 MG tablet Take 1 tablet (10 mg total) by mouth daily.  ? [DISCONTINUED] nebivolol (BYSTOLIC) 10 MG tablet Take 1 tablet (10 mg total) by mouth daily.  ? ? ? ?  11/04/2021  ?  9:35 AM 04/22/2021  ?  9:09 AM 10/30/2020  ?  9:21 AM 10/27/2019  ?  9:06 AM  ?GAD 7 : Generalized Anxiety Score  ?Nervous, Anxious, on Edge 0 0 0 0  ?Control/stop worrying 0 0 0 0  ?Worry too much - different things 0 0 0 0  ?Trouble relaxing 0 0 0 0  ?Restless 0 0 0 0  ?Easily annoyed or irritable 0 0 0 0  ?Afraid - awful might happen 0 0 0 0  ?Total GAD 7 Score 0 0 0 0  ?Anxiety Difficulty    Not difficult at all  ? ? ? ?  11/04/2021  ?  9:35 AM  ?Depression screen PHQ 2/9  ?Decreased Interest 0  ?Down, Depressed, Hopeless 0  ?PHQ - 2 Score 0  ?Altered sleeping 0  ?Tired, decreased energy 0  ?Change in appetite 0  ?Feeling bad or failure about yourself  0  ?Trouble concentrating 0  ?Moving slowly or fidgety/restless 0  ?Suicidal thoughts 0  ?PHQ-9 Score 0  ?Difficult doing work/chores Not difficult at all  ? ? ?BP Readings from Last 3 Encounters:  ?11/04/21 (!) 144/100  ?04/22/21 (!) 170/120  ?12/25/20 (!) 200/121  ? ? ?Physical Exam ?Vitals and nursing note reviewed.  ?Constitutional:   ?   Appearance: Normal appearance. He is well-developed.  ?HENT:  ?   Head: Normocephalic.  ?   Right Ear: Tympanic membrane, ear canal and external ear normal.  ?   Left Ear: Tympanic membrane, ear canal and external ear normal.  ?   Nose: Nose normal.  ?Eyes:  ?   Conjunctiva/sclera: Conjunctivae normal.  ?   Pupils: Pupils are equal, round, and reactive to light.  ?Neck:  ?   Thyroid: No thyromegaly.  ?   Vascular: No carotid bruit.  ?Cardiovascular:  ?   Rate and Rhythm: Normal rate and regular rhythm.  ?   Heart sounds: Normal heart sounds.  ?Pulmonary:  ?   Effort: Pulmonary effort is normal.  ?   Breath sounds: Normal breath sounds. No wheezing.  ?Chest:  ?Breasts: ?    Right: No mass.  ?   Left: No mass.  ?Abdominal:  ?   General: Bowel sounds are normal.  ?   Palpations: Abdomen is soft.  ?   Tenderness: There is no abdominal tenderness.  ?Musculoskeletal:     ?   General: Normal range of motion.  ?   Cervical back: Normal range of motion and neck supple.  ?Lymphadenopathy:  ?   Cervical: No cervical adenopathy.  ?Skin: ?   General: Skin  is warm and dry.  ?Neurological:  ?   Mental Status: He is alert and oriented to person, place, and time.  ?   Deep Tendon Reflexes: Reflexes are normal and symmetric.  ?Psychiatric:     ?   Attention and Perception: Attention normal.     ?   Mood and Affect: Mood normal.     ?   Thought Content: Thought content normal.  ? ? ?Wt Readings from Last 3 Encounters:  ?11/04/21 (!) 313 lb (142 kg)  ?04/22/21 (!) 311 lb (141.1 kg)  ?12/25/20 (!) 312 lb (141.5 kg)  ? ? ?BP (!) 144/100 (BP Location: Right Arm, Cuff Size: Large)   Pulse (!) 58   Ht '5\' 11"'  (1.803 m)   Wt (!) 313 lb (142 kg)   SpO2 95%   BMI 43.65 kg/m?  ? ?Assessment and Plan: ?1. Annual physical exam ?Exam is normal except for weight. Encourage regular exercise and appropriate dietary changes. ?Up to date on screenings and immunizations. ? ?2. Prostate cancer screening ?DRE deferred ?- PSA ? ?3. Primary hypertension ?BP not controlled on current regimen. ?Will increase bystolic to 20 mg; continue amlodipine 10 mg. ?Recheck in 3 months. ?- CBC with Differential/Platelet ?- Comprehensive metabolic panel ?- POCT urinalysis dipstick ?- amLODipine (NORVASC) 10 MG tablet; Take 1 tablet (10 mg total) by mouth daily.  Dispense: 90 tablet; Refill: 1 ?- Nebivolol HCl (BYSTOLIC) 20 MG TABS; Take 1 tablet (20 mg total) by mouth daily.  Dispense: 90 tablet; Refill: 1 ? ?4. BMI 40.0-44.9, adult ([place]) ?Work on diet and begin regular exercise ? ?5. Mixed hyperlipidemia ?- Lipid panel ? ?6. Screening for diabetes mellitus ?- Hemoglobin A1c ? ?7. Chronic hepatitis C without hepatic coma ([facility]) ?Pt has  struggled getting in touch with GI ?We will refer again - asked for an appt as he is flexible with his schedule. ?Re-enforced the need to treat his Hep C to avoid complications of cirrhosis and HCC. ? ? ?Partially d

## 2021-11-04 NOTE — Progress Notes (Signed)
? ? ?Date:  11/04/2021  ? ?Name:  Andrew Hodge   DOB:  06-27-1961   MRN:  [id number] ? ? ?Chief Complaint: Annual Exam ? ?Andrew Hodge is a 61 y.o. male who presents today for his Complete Annual Exam. He feels well. He reports exercising none. He reports he is sleeping well.  ? ?Colonoscopy: 03/18/2018 ? ?Immunization History  ?Administered Date(s) Administered  ? PFIZER(Purple Top)SARS-COV-2 Vaccination 10/28/2019, 11/22/2019  ? Tdap 12/12/2015  ? ?Health Maintenance Due  ?Topic Date Due  ? Zoster Vaccines- Shingrix (1 of 2) Never done  ? COVID-19 Vaccine (3 - Booster for Pfizer series) 01/17/2020  ?  ?Lab Results  ?Component Value Date  ? PSA1 0.3 10/30/2020  ? PSA1 0.2 10/26/2018  ? PSA1 0.3 10/22/2017  ? ? ? ? ? ? ?HPI ? ?Lab Results  ?Component Value Date  ? NA 138 10/30/2020  ? K 4.1 10/30/2020  ? CO2 19 (L) 10/30/2020  ? GLUCOSE 101 (H) 10/30/2020  ? BUN 14 10/30/2020  ? CREATININE 0.85 10/30/2020  ? CALCIUM 9.2 10/30/2020  ? EGFR 100 10/30/2020  ? GFRNONAA >60 10/27/2019  ? ?Lab Results  ?Component Value Date  ? CHOL 193 10/30/2020  ? HDL 48 10/30/2020  ? LDLCALC 114 (H) 10/30/2020  ? TRIG 174 (H) 10/30/2020  ? CHOLHDL 4.0 10/30/2020  ? ?Lab Results  ?Component Value Date  ? TSH 4.020 10/26/2018  ? ?No results found for: HGBA1C ?Lab Results  ?Component Value Date  ? WBC 4.2 10/30/2020  ? HGB 15.7 10/30/2020  ? HCT 46.1 10/30/2020  ? MCV 89 10/30/2020  ? PLT 231 10/30/2020  ? ?Lab Results  ?Component Value Date  ? ALT 60 (H) 10/30/2020  ? AST 33 10/30/2020  ? ALKPHOS 95 10/30/2020  ? BILITOT 0.6 10/30/2020  ? ?No results found for: 25OHVITD2, [place], VD25OH  ? ?Review of Systems ? ?Patient Active Problem List  ? Diagnosis Date Noted  ? Benign neoplasm of transverse colon   ? Hepatitis C, chronic ([place]) 10/24/2017  ? Hypertension 07/08/2016  ? Obesity, morbid ([place]) 06/26/2016  ? ? ?No Known Allergies ? ?Past Surgical History:  ?Procedure Laterality Date  ? CLOSED MANIPULATION SHOULDER    ? dislocated  shoulder - age 29  ? COLONOSCOPY WITH PROPOFOL N/A 03/18/2018  ? Procedure: COLONOSCOPY WITH PROPOFOL;  Surgeon: Lucilla Lame, MD;  Location: [place];  Service: Endoscopy;  Laterality: N/A;  ? POLYPECTOMY N/A 03/18/2018  ? Procedure: POLYPECTOMY;  Surgeon: Lucilla Lame, MD;  Location: [place];  Service: Endoscopy;  Laterality: N/A;  ? ? ?Social History  ? ?Tobacco Use  ? Smoking status: Never  ? Smokeless tobacco: Never  ?Vaping Use  ? Vaping Use: Never used  ?Substance Use Topics  ? Alcohol use: Yes  ?  Alcohol/week: 6.0 standard drinks  ?  Types: 6 Cans of beer per week  ?  Comment: on weekends  ? Drug use: No  ? ? ? ?Medication list has been reviewed and updated. ? ?No outpatient medications have been marked as taking for the 11/04/21 encounter (Office Visit) with Glean Hess, MD.  ? ? ? ?  04/22/2021  ?  9:09 AM 10/30/2020  ?  9:21 AM 10/27/2019  ?  9:06 AM  ?GAD 7 : Generalized Anxiety Score  ?Nervous, Anxious, on Edge 0 0 0  ?Control/stop worrying 0 0 0  ?Worry too much - different things 0 0 0  ?Trouble relaxing 0 0 0  ?  Restless 0 0 0  ?Easily annoyed or irritable 0 0 0  ?Afraid - awful might happen 0 0 0  ?Total GAD 7 Score 0 0 0  ?Anxiety Difficulty   Not difficult at all  ? ? ? ?  04/22/2021  ?  9:08 AM  ?Depression screen PHQ 2/9  ?Decreased Interest 0  ?Down, Depressed, Hopeless 0  ?PHQ - 2 Score 0  ?Altered sleeping 0  ?Tired, decreased energy 0  ?Change in appetite 0  ?Feeling bad or failure about yourself  0  ?Trouble concentrating 0  ?Moving slowly or fidgety/restless 0  ?Suicidal thoughts 0  ?PHQ-9 Score 0  ?Difficult doing work/chores Not difficult at all  ? ? ?BP Readings from Last 3 Encounters:  ?04/22/21 (!) 170/120  ?12/25/20 (!) 200/121  ?10/30/20 138/86  ? ? ?Physical Exam ? ?Wt Readings from Last 3 Encounters:  ?04/22/21 (!) 311 lb (141.1 kg)  ?12/25/20 (!) 312 lb (141.5 kg)  ?10/30/20 (!) 310 lb (140.6 kg)  ? ? ?Ht _0  (1.803 m)   BMI 43.38 kg/m?  ? ?Assessment and  Plan: ? ? ? ? ?

## 2021-11-05 LAB — COMPREHENSIVE METABOLIC PANEL
ALT: 65 IU/L — ABNORMAL HIGH (ref 0–44)
AST: 34 IU/L (ref 0–40)
Albumin/Globulin Ratio: 1.4 (ref 1.2–2.2)
Albumin: 4.2 g/dL (ref 3.8–4.9)
Alkaline Phosphatase: 96 IU/L (ref 44–121)
BUN/Creatinine Ratio: 20 (ref 10–24)
BUN: 18 mg/dL (ref 8–27)
Bilirubin Total: 0.4 mg/dL (ref 0.0–1.2)
CO2: 25 mmol/L (ref 20–29)
Calcium: 10.1 mg/dL (ref 8.6–10.2)
Chloride: 105 mmol/L (ref 96–106)
Creatinine, Ser: 0.89 mg/dL (ref 0.76–1.27)
Globulin, Total: 3 g/dL (ref 1.5–4.5)
Glucose: 109 mg/dL — ABNORMAL HIGH (ref 70–99)
Potassium: 5.1 mmol/L (ref 3.5–5.2)
Sodium: 141 mmol/L (ref 134–144)
Total Protein: 7.2 g/dL (ref 6.0–8.5)
eGFR: 98 mL/min/{1.73_m2} (ref >59)

## 2021-11-05 LAB — LIPID PANEL
Chol/HDL Ratio: 4.3 ratio (ref 0.0–5.0)
Cholesterol, Total: 210 mg/dL — ABNORMAL HIGH (ref 100–199)
HDL: 49 mg/dL (ref >39)
LDL Chol Calc (NIH): 132 mg/dL — ABNORMAL HIGH (ref 0–99)
Triglycerides: 161 mg/dL — ABNORMAL HIGH (ref 0–149)
VLDL Cholesterol Cal: 29 mg/dL (ref 5–40)

## 2021-11-05 LAB — CBC WITH DIFFERENTIAL/PLATELET
Basophils Absolute: 0 10*3/uL (ref 0.0–0.2)
Basos: 1 %
EOS (ABSOLUTE): 0.2 10*3/uL (ref 0.0–0.4)
Eos: 4 %
Hematocrit: 47.9 % (ref 37.5–51.0)
Hemoglobin: 16.6 g/dL (ref 13.0–17.7)
Immature Grans (Abs): 0 10*3/uL (ref 0.0–0.1)
Immature Granulocytes: 1 %
Lymphocytes Absolute: 1.5 10*3/uL (ref 0.7–3.1)
Lymphs: 34 %
MCH: 30.2 pg (ref 26.6–33.0)
MCHC: 34.7 g/dL (ref 31.5–35.7)
MCV: 87 fL (ref 79–97)
Monocytes Absolute: 0.5 10*3/uL (ref 0.1–0.9)
Monocytes: 12 %
Neutrophils Absolute: 2.1 10*3/uL (ref 1.4–7.0)
Neutrophils: 48 %
Platelets: 237 10*3/uL (ref 150–450)
RBC: 5.5 x10E6/uL (ref 4.14–5.80)
RDW: 12.6 % (ref 11.6–15.4)
WBC: 4.3 10*3/uL (ref 3.4–10.8)

## 2021-11-05 LAB — PSA: Prostate Specific Ag, Serum: 0.2 ng/mL (ref 0.0–4.0)

## 2021-11-05 LAB — HEMOGLOBIN A1C
Est. average glucose Bld gHb Est-mCnc: 120 mg/dL
Hgb A1c MFr Bld: 5.8 % — ABNORMAL HIGH (ref 4.8–5.6)

## 2022-05-15 ENCOUNTER — Encounter: Payer: Self-pay | Admitting: Gastroenterology

## 2022-05-15 ENCOUNTER — Ambulatory Visit: Payer: Self-pay

## 2022-05-15 ENCOUNTER — Ambulatory Visit (INDEPENDENT_AMBULATORY_CARE_PROVIDER_SITE_OTHER): Payer: 59 | Admitting: Gastroenterology

## 2022-05-15 VITALS — BP 198/104 | HR 60 | Wt 314.0 lb

## 2022-05-15 DIAGNOSIS — B182 Chronic viral hepatitis C: Secondary | ICD-10-CM

## 2022-05-15 DIAGNOSIS — R69 Illness, unspecified: Secondary | ICD-10-CM | POA: Diagnosis not present
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
## 2022-05-15 NOTE — Telephone Encounter (Signed)
  Chief Complaint: High BP readings Symptoms: none Frequency: ongoing Pertinent Negatives: Patient denies any s/s Disposition: '[]'$ ED /'[]'$ Urgent Care (no appt availability in office) / '[x]'$ Appointment(In office/virtual)/ '[]'$  [facility]/ '[]'$ Home Care/ '[]'$ Refused Recommended Disposition /'[]'$ [facility]/ '[]'$  Follow-up with PCP Additional Notes: PT called as he had some high BP readings today at OV with another provider. Pt was unsure of actual numbers. Pt states that every time he has bp taken it is high and the medication he has been given is not working. Pt at first just wanted a medication change, but then accepted an appt. Pt was in a hurry.     Reason for Disposition  Systolic BP  >= 914 OR Diastolic >= 782  Answer Assessment - Initial Assessment Questions 1. BLOOD PRESSURE: "What is the blood pressure?" "Did you take at least two measurements 5 minutes apart?"     170 and 180 over something 2. ONSET: "When did you take your blood pressure?"     At Dr. Gabriel Carina earlier today 3. HOW: "How did you take your blood pressure?" (e.g., automatic home BP monitor, visiting nurse)     At dr. office 4. HISTORY: "Do you have a history of high blood pressure?"     yes 5. MEDICINES: "Are you taking any medicines for blood pressure?" "Have you missed any doses recently?"     Yes - none missed 6. OTHER SYMPTOMS: "Do you have any symptoms?" (e.g., blurred vision, chest pain, difficulty breathing, headache, weakness)     no 7. PREGNANCY: "Is there any chance you are pregnant?" "When was your last menstrual period?"  Protocols used: Blood Pressure - High-A-AH

## 2022-05-17 LAB — COMPREHENSIVE METABOLIC PANEL
ALT: 53 IU/L — ABNORMAL HIGH (ref 0–44)
AST: 31 IU/L (ref 0–40)
Albumin/Globulin Ratio: 1.8 (ref 1.2–2.2)
Albumin: 4.4 g/dL (ref 3.8–4.9)
Alkaline Phosphatase: 77 IU/L (ref 44–121)
BUN/Creatinine Ratio: 12 (ref 10–24)
BUN: 10 mg/dL (ref 8–27)
Bilirubin Total: 0.4 mg/dL (ref 0.0–1.2)
CO2: 22 mmol/L (ref 20–29)
Calcium: 9.3 mg/dL (ref 8.6–10.2)
Chloride: 105 mmol/L (ref 96–106)
Creatinine, Ser: 0.83 mg/dL (ref 0.76–1.27)
Globulin, Total: 2.5 g/dL (ref 1.5–4.5)
Glucose: 132 mg/dL — ABNORMAL HIGH (ref 70–99)
Potassium: 4 mmol/L (ref 3.5–5.2)
Sodium: 141 mmol/L (ref 134–144)
Total Protein: 6.9 g/dL (ref 6.0–8.5)
eGFR: 100 mL/min/{1.73_m2} (ref >59)

## 2022-05-17 LAB — HCV FIBROSURE
ALPHA 2-MACROGLOBULINS, QN: 256 mg/dL (ref 110–276)
ALT (SGPT) P5P: 58 IU/L — ABNORMAL HIGH (ref 0–55)
Apolipoprotein A-1: 151 mg/dL (ref 101–178)
Bilirubin, Total: 0.3 mg/dL (ref 0.0–1.2)
Fibrosis Score: 0.49 — ABNORMAL HIGH (ref 0.00–0.21)
GGT: 105 IU/L — ABNORMAL HIGH (ref 0–65)
Haptoglobin: 73 mg/dL (ref 29–370)
Necroinflammat Activity Score: 0.41 — ABNORMAL HIGH (ref 0.00–0.17)

## 2022-05-17 LAB — HCV RNA QUANT
HCV log10: 6.509 log10 IU/mL
Hepatitis C Quantitation: 3230000 IU/mL

## 2022-05-19 ENCOUNTER — Ambulatory Visit (INDEPENDENT_AMBULATORY_CARE_PROVIDER_SITE_OTHER): Payer: Self-pay | Admitting: Internal Medicine

## 2022-05-19 ENCOUNTER — Encounter: Payer: Self-pay | Admitting: Internal Medicine

## 2022-05-19 VITALS — BP 142/99 | HR 53 | Ht 71.0 in | Wt 309.8 lb

## 2022-05-19 DIAGNOSIS — I10 Essential (primary) hypertension: Secondary | ICD-10-CM

## 2022-05-19 MED ORDER — IRBESARTAN 150 MG PO TABS: 150.0000 mg | ORAL_TABLET | Freq: Every day | ORAL | 0 refills | Status: DC

## 2022-05-19 NOTE — Progress Notes (Signed)
Date:  05/19/2022   Name:  Andrew Hodge   DOB:  Jun 13, 1961   MRN:  [id number]   Chief Complaint: Hypertension  Hypertension This is a chronic problem. The problem is uncontrolled. Pertinent negatives include no chest pain, headaches, palpitations or shortness of breath. Past treatments include calcium channel blockers and beta blockers (bystolic increased to 20 mg last visit).   Hep C - recent labs with Dr. Allen Norris - no advanced fibrosis or cirrhosis.  He would be a good candidate for treatment.  Lab Results  Component Value Date   NA 141 05/15/2022   K 4.0 05/15/2022   CO2 22 05/15/2022   GLUCOSE 132 (H) 05/15/2022   BUN 10 05/15/2022   CREATININE 0.83 05/15/2022   CALCIUM 9.3 05/15/2022   EGFR 100 05/15/2022   GFRNONAA >60 10/27/2019   Lab Results  Component Value Date   CHOL 210 (H) 11/04/2021   HDL 49 11/04/2021   LDLCALC 132 (H) 11/04/2021   TRIG 161 (H) 11/04/2021   CHOLHDL 4.3 11/04/2021   Lab Results  Component Value Date   TSH 4.020 10/26/2018   Lab Results  Component Value Date   HGBA1C 5.8 (H) 11/04/2021   Lab Results  Component Value Date   WBC 4.3 11/04/2021   HGB 16.6 11/04/2021   HCT 47.9 11/04/2021   MCV 87 11/04/2021   PLT 237 11/04/2021   Lab Results  Component Value Date   ALT 53 (H) 05/15/2022   AST 31 05/15/2022   ALKPHOS 77 05/15/2022   BILITOT 0.4 05/15/2022   No results found for: "25OHVITD2", "25OHVITD3", "VD25OH"   Review of Systems  Constitutional:  Negative for fatigue and unexpected weight change.  HENT:  Negative for nosebleeds.   Eyes:  Negative for visual disturbance.  Respiratory:  Negative for cough, chest tightness, shortness of breath and wheezing.   Cardiovascular:  Negative for chest pain, palpitations and leg swelling.  Gastrointestinal:  Negative for abdominal pain, constipation and diarrhea.  Neurological:  Negative for dizziness, weakness, light-headedness and headaches.    Patient Active Problem List    Diagnosis Date Noted   Benign neoplasm of transverse colon    Hepatitis C, chronic ([place]) 10/24/2017   Hypertension 07/08/2016   Obesity, morbid ([place]) 06/26/2016    No Known Allergies  Past Surgical History:  Procedure Laterality Date   CLOSED MANIPULATION SHOULDER     dislocated shoulder - age 75   COLONOSCOPY WITH PROPOFOL N/A 03/18/2018   Procedure: COLONOSCOPY WITH PROPOFOL;  Surgeon: Lucilla Lame, MD;  Location: [place];  Service: Endoscopy;  Laterality: N/A;   POLYPECTOMY N/A 03/18/2018   Procedure: POLYPECTOMY;  Surgeon: Lucilla Lame, MD;  Location: [place];  Service: Endoscopy;  Laterality: N/A;    Social History   Tobacco Use   Smoking status: Never   Smokeless tobacco: Never  Vaping Use   Vaping Use: Never used  Substance Use Topics   Alcohol use: Yes    Alcohol/week: 6.0 standard drinks of alcohol    Types: 6 Cans of beer per week    Comment: on weekends   Drug use: No     Medication list has been reviewed and updated.  Current Meds  Medication Sig   amLODipine (NORVASC) 10 MG tablet Take 1 tablet (10 mg total) by mouth daily.   irbesartan (AVAPRO) 150 MG tablet Take 1 tablet (150 mg total) by mouth daily.   Nebivolol HCl (BYSTOLIC) 20 MG TABS Take 1 tablet (20  mg total) by mouth daily.   sildenafil (VIAGRA) 100 MG tablet TAKE 0.5-1 TABLETS (50-100 MG TOTAL) BY MOUTH DAILY AS NEEDED FOR ERECTILE DYSFUNCTION.       05/19/2022    1:31 PM 11/04/2021    9:35 AM 04/22/2021    9:09 AM 10/30/2020    9:21 AM  GAD 7 : Generalized Anxiety Score  Nervous, Anxious, on Edge 0 0 0 0  Control/stop worrying 0 0 0 0  Worry too much - different things 0 0 0 0  Trouble relaxing 0 0 0 0  Restless 0 0 0 0  Easily annoyed or irritable 0 0 0 0  Afraid - awful might happen 0 0 0 0  Total GAD 7 Score 0 0 0 0  Anxiety Difficulty Not difficult at all          05/19/2022    1:31 PM 11/04/2021    9:35 AM 04/22/2021    9:08 AM  Depression screen PHQ  2/9  Decreased Interest 0 0 0  Down, Depressed, Hopeless 0 0 0  PHQ - 2 Score 0 0 0  Altered sleeping 0 0 0  Tired, decreased energy 0 0 0  Change in appetite 0 0 0  Feeling bad or failure about yourself  0 0 0  Trouble concentrating 0 0 0  Moving slowly or fidgety/restless 0 0 0  Suicidal thoughts 0 0 0  PHQ-9 Score 0 0 0  Difficult doing work/chores Not difficult at all Not difficult at all Not difficult at all    BP Readings from Last 3 Encounters:  05/19/22 (!) 142/99  11/04/21 (!) 144/100  04/22/21 (!) 170/120    Physical Exam Vitals and nursing note reviewed.  Constitutional:      General: He is not in acute distress.    Appearance: He is well-developed.  HENT:     Head: Normocephalic and atraumatic.  Cardiovascular:     Rate and Rhythm: Normal rate and regular rhythm.     Pulses: Normal pulses.     Heart sounds: No murmur heard. Pulmonary:     Effort: Pulmonary effort is normal. No respiratory distress.     Breath sounds: No wheezing or rhonchi.  Musculoskeletal:     Cervical back: Normal range of motion.     Right lower leg: No edema.     Left lower leg: No edema.  Skin:    General: Skin is warm and dry.     Findings: No rash.  Neurological:     Mental Status: He is alert and oriented to person, place, and time.  Psychiatric:        Mood and Affect: Mood normal.        Behavior: Behavior normal.     Wt Readings from Last 3 Encounters:  05/19/22 (!) 309 lb 12.8 oz (140.5 kg)  11/04/21 (!) 313 lb (142 kg)  04/22/21 (!) 311 lb (141.1 kg)    BP (!) 142/99 (BP Location: Left Arm, Cuff Size: Large)   Pulse (!) 53   Ht 5' 11" (1.803 m)   Wt (!) 309 lb 12.8 oz (140.5 kg)   SpO2 95%   BMI 43.21 kg/m   Assessment and Plan: 1. Primary hypertension Not controlled on Amlodipine 10 mg and Bystolic 20 mg. Will add Irbesartan 150 mg. Follow up OV in 2 months. - irbesartan (AVAPRO) 150 MG tablet; Take 1 tablet (150 mg total) by mouth daily.  Dispense: 90  tablet; Refill: 0  Partially dictated using Editor, commissioning. Any errors are unintentional.  Halina Maidens, MD [facility]  05/19/2022

## 2022-05-23 NOTE — Addendum Note (Signed)
Addended by: Lurlean Nanny on: 05/23/2022 12:35 PM   Modules accepted: Orders

## 2022-05-26 DIAGNOSIS — R69 Illness, unspecified: Secondary | ICD-10-CM | POA: Diagnosis not present

## 2022-05-27 ENCOUNTER — Ambulatory Visit
Admission: EM | Admit: 2022-05-27 | Discharge: 2022-05-27 | Disposition: A | Discharge status: Home / Self Care | Payer: 59 | Attending: Family Medicine | Admitting: Family Medicine

## 2022-05-27 ENCOUNTER — Encounter: Payer: Self-pay | Admitting: Emergency Medicine

## 2022-05-27 DIAGNOSIS — J209 Acute bronchitis, unspecified: Secondary | ICD-10-CM

## 2022-05-27 DIAGNOSIS — I10 Essential (primary) hypertension: Secondary | ICD-10-CM | POA: Diagnosis not present

## 2022-05-27 MED ORDER — AZITHROMYCIN 250 MG PO TABS: 250.0000 mg | ORAL_TABLET | Freq: Once | ORAL | 0 refills | Status: AC

## 2022-05-27 MED ORDER — PROMETHAZINE-DM 6.25-15 MG/5ML PO SYRP: 5.0000 mL | ORAL_SOLUTION | Freq: Four times a day (QID) | ORAL | 0 refills | Status: DC | PRN

## 2022-05-27 MED ORDER — PREDNISONE 20 MG PO TABS: 40.0000 mg | ORAL_TABLET | Freq: Every day | ORAL | 0 refills | Status: AC

## 2022-05-27 MED ORDER — BENZONATATE 100 MG PO CAPS: 100.0000 mg | ORAL_CAPSULE | Freq: Three times a day (TID) | ORAL | 0 refills | Status: DC

## 2022-05-27 NOTE — Discharge Instructions (Addendum)

## 2022-05-27 NOTE — ED Triage Notes (Signed)
Pt c/o dry cough. Started about 2-3 weeks ago. He states he has been taking cough syrup but has not helped. Pt states "I need an antibiotic"
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
## 2022-05-29 ENCOUNTER — Telehealth: Payer: Self-pay

## 2022-05-29 LAB — HEPATITIS C GENOTYPE

## 2022-05-29 NOTE — Telephone Encounter (Signed)
HCV referral form with demographics and insurance with labs faxed to [facility] for Rx Mavyret 8 week treatment

## 2022-06-18 ENCOUNTER — Encounter: Payer: Self-pay | Admitting: Emergency Medicine

## 2022-06-18 ENCOUNTER — Ambulatory Visit
Admission: EM | Admit: 2022-06-18 | Discharge: 2022-06-18 | Disposition: A | Discharge status: Home / Self Care | Payer: 59 | Attending: Physician Assistant | Admitting: Physician Assistant

## 2022-06-18 ENCOUNTER — Ambulatory Visit (INDEPENDENT_AMBULATORY_CARE_PROVIDER_SITE_OTHER): Payer: 59

## 2022-06-18 DIAGNOSIS — R059 Cough, unspecified: Secondary | ICD-10-CM

## 2022-06-18 DIAGNOSIS — I10 Essential (primary) hypertension: Secondary | ICD-10-CM

## 2022-06-18 MED ORDER — HYDROCOD POLI-CHLORPHE POLI ER 10-8 MG/5ML PO SUER: 5.0000 mL | Freq: Two times a day (BID) | ORAL | 0 refills | Status: DC | PRN

## 2022-06-18 MED ORDER — BENZONATATE 100 MG PO CAPS: 100.0000 mg | ORAL_CAPSULE | Freq: Three times a day (TID) | ORAL | 0 refills | Status: DC

## 2022-06-18 MED ORDER — PREDNISONE 20 MG PO TABS: 40.0000 mg | ORAL_TABLET | Freq: Every day | ORAL | 0 refills | Status: AC

## 2022-06-18 MED ORDER — ALBUTEROL SULFATE HFA 108 (90 BASE) MCG/ACT IN AERS: 1.0000 | INHALATION_SPRAY | Freq: Four times a day (QID) | RESPIRATORY_TRACT | 0 refills | Status: DC | PRN

## 2022-06-18 NOTE — Discharge Instructions (Signed)
-  Prednisone: 2 tablets once a day for 5 days -Albuterol: 1-2 puffs every 6 hours as needed for coughing fits -Testing next: 5 mL every 12 hours as needed for cough -Chest x-ray was negative for any acute abnormalities.   -Recommend follow-up with your primary care for possible referral to pulmonology for the continued cough.  Can also discuss possibility of the irbesartan being because of the cough.  It is not typical for this medication but can cause cough and a small percentage of patients.
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
## 2022-06-18 NOTE — ED Triage Notes (Signed)
Pt c/o cough. Started about 3 weeks ago. He states he was treated for the cough but not has not gone away. Denies fever.

## 2022-06-25 ENCOUNTER — Telehealth: Payer: Self-pay

## 2022-06-25 NOTE — Telephone Encounter (Signed)
I called pt to see if he has received Hep C medication... No answer and VM not set up

## 2022-07-01 ENCOUNTER — Other Ambulatory Visit: Payer: Self-pay | Admitting: Internal Medicine

## 2022-07-01 DIAGNOSIS — I10 Essential (primary) hypertension: Secondary | ICD-10-CM

## 2022-07-01 MED ORDER — NEBIVOLOL HCL 20 MG PO TABS: 20.0000 mg | ORAL_TABLET | Freq: Every day | ORAL | 1 refills | Status: DC

## 2022-07-01 NOTE — Telephone Encounter (Signed)
Requested Prescriptions  Pending Prescriptions Disp Refills   Nebivolol HCl (BYSTOLIC) 20 MG TABS 90 tablet 1    Sig: Take 1 tablet (20 mg total) by mouth daily.     Cardiovascular: Beta Blockers 3 Failed - 07/01/2022  2:40 PM      Failed - ALT in normal range and within 360 days    ALT  Date Value Ref Range Status  05/15/2022 53 (H) 0 - 44 IU/L Final   ALT (SGPT) P5P  Date Value Ref Range Status  05/15/2022 58 (H) 0 - 55 IU/L Final         Failed - Last BP in normal range    BP Readings from Last 1 Encounters:  06/18/22 (!) 156/92         Passed - Cr in normal range and within 360 days    Creatinine, Ser  Date Value Ref Range Status  05/15/2022 0.83 0.76 - 1.27 mg/dL Final         Passed - AST in normal range and within 360 days    AST  Date Value Ref Range Status  05/15/2022 31 0 - 40 IU/L Final         Passed - Last Heart Rate in normal range    Pulse Readings from Last 1 Encounters:  06/18/22 65         Passed - Valid encounter within last 6 months    Recent Outpatient Visits           1 month ago Primary hypertension   [facility], Jesse Sans, MD   7 months ago Annual physical exam   [facility] at [facility], Jesse Sans, MD   1 year ago Primary hypertension   [facility] at [facility], Jesse Sans, MD   1 year ago Annual physical exam   [facility] at [facility], Jesse Sans, MD   2 years ago Annual physical exam   [facility] at [facility], Jesse Sans, MD       Future Appointments             In 3 weeks Army Melia Jesse Sans, MD [place] [facility] at [facility], [facility]

## 2022-07-01 NOTE — Telephone Encounter (Signed)
Copied from [place] [id number]. Topic: General - Other >> Jul 01, 2022  2:35 PM Everette C wrote: Reason for CRM: Medication Refill - Medication: Nebivolol HCl (BYSTOLIC) 20 MG TABS [[id number]]  Has the patient contacted their pharmacy? Yes.    (Agent: If no, request that the patient contact the pharmacy for the refill. If patient does not wish to contact the pharmacy document the reason why and proceed with request.) (Agent: If yes, when and what did the pharmacy advise?)  Preferred Pharmacy (with phone number or street name): [place], [place] - [address] [place] [address] Phone: [phone] Fax: [phone] Hours: Not open 24 hours   Has the patient been seen for an appointment in the last year OR does the patient have an upcoming appointment? Yes.    Agent: Please be advised that RX refills may take up to 3 business days. We ask that you follow-up with your pharmacy.

## 2022-07-08 NOTE — Telephone Encounter (Signed)
Patient's insurance denied coverage for [place]... The preferred in [place]...   Please advise if okay to change and provide sig and time of treatment needed for [facility]: Lambert Rhonda W

## 2022-07-23 NOTE — Telephone Encounter (Signed)
Ppw faxed to [facility] for Epclusa Rx

## 2022-07-28 ENCOUNTER — Encounter: Payer: Self-pay | Admitting: Internal Medicine

## 2022-07-28 ENCOUNTER — Ambulatory Visit (INDEPENDENT_AMBULATORY_CARE_PROVIDER_SITE_OTHER): Payer: 59 | Admitting: Internal Medicine

## 2022-07-28 VITALS — BP 126/84 | HR 60 | Ht 71.0 in | Wt 323.0 lb

## 2022-07-28 DIAGNOSIS — I10 Essential (primary) hypertension: Secondary | ICD-10-CM | POA: Diagnosis not present

## 2022-07-28 DIAGNOSIS — R361 Hematospermia: Secondary | ICD-10-CM

## 2022-07-28 DIAGNOSIS — B182 Chronic viral hepatitis C: Secondary | ICD-10-CM | POA: Diagnosis not present

## 2022-07-28 DIAGNOSIS — R69 Illness, unspecified: Secondary | ICD-10-CM | POA: Diagnosis not present

## 2022-07-28 MED ORDER — AMLODIPINE BESYLATE 10 MG PO TABS: 10.0000 mg | ORAL_TABLET | Freq: Every day | ORAL | 1 refills | Status: DC

## 2022-07-28 MED ORDER — IRBESARTAN 150 MG PO TABS: 150.0000 mg | ORAL_TABLET | Freq: Every day | ORAL | 1 refills | Status: DC

## 2022-07-28 MED ORDER — NEBIVOLOL HCL 20 MG PO TABS: 20.0000 mg | ORAL_TABLET | Freq: Every day | ORAL | 1 refills | Status: DC

## 2022-07-28 NOTE — Progress Notes (Signed)
Date:  07/28/2022   Name:  Andrew Hodge   DOB:  08-26-1960   MRN:  [id number]   Chief Complaint: Hypertension  Hypertension This is a chronic problem. The problem is controlled. Pertinent negatives include no chest pain, headaches, palpitations or shortness of breath. Past treatments include calcium channel blockers, angiotensin blockers and beta blockers.   One episode of scant blood in ejaculate last week.  No other symptoms - no dysuria, no penile sores.  He has not had intercourse again since then. PSA was normal in April.   Lab Results  Component Value Date   NA 141 05/15/2022   K 4.0 05/15/2022   CO2 22 05/15/2022   GLUCOSE 132 (H) 05/15/2022   BUN 10 05/15/2022   CREATININE 0.83 05/15/2022   CALCIUM 9.3 05/15/2022   EGFR 100 05/15/2022   GFRNONAA >60 10/27/2019   Lab Results  Component Value Date   CHOL 210 (H) 11/04/2021   HDL 49 11/04/2021   LDLCALC 132 (H) 11/04/2021   TRIG 161 (H) 11/04/2021   CHOLHDL 4.3 11/04/2021   Lab Results  Component Value Date   TSH 4.020 10/26/2018   Lab Results  Component Value Date   HGBA1C 5.8 (H) 11/04/2021   Lab Results  Component Value Date   WBC 4.3 11/04/2021   HGB 16.6 11/04/2021   HCT 47.9 11/04/2021   MCV 87 11/04/2021   PLT 237 11/04/2021   Lab Results  Component Value Date   ALT 53 (H) 05/15/2022   AST 31 05/15/2022   ALKPHOS 77 05/15/2022   BILITOT 0.4 05/15/2022   No results found for: "25OHVITD2", "25OHVITD3", "VD25OH"   Review of Systems  Constitutional:  Negative for fatigue and unexpected weight change.  HENT:  Negative for nosebleeds.   Eyes:  Negative for visual disturbance.  Respiratory:  Negative for cough, chest tightness, shortness of breath and wheezing.   Cardiovascular:  Negative for chest pain, palpitations and leg swelling.  Gastrointestinal:  Negative for abdominal pain, constipation and diarrhea.  Genitourinary:  Negative for dysuria, genital sores, penile discharge and  testicular pain.  Neurological:  Negative for dizziness, weakness, light-headedness and headaches.    Patient Active Problem List   Diagnosis Date Noted   Benign neoplasm of transverse colon    Hepatitis C, chronic ([place]) 10/24/2017   Hypertension 07/08/2016   Obesity, morbid ([place]) 06/26/2016    No Known Allergies  Past Surgical History:  Procedure Laterality Date   CLOSED MANIPULATION SHOULDER     dislocated shoulder - age 73   COLONOSCOPY WITH PROPOFOL N/A 03/18/2018   Procedure: COLONOSCOPY WITH PROPOFOL;  Surgeon: Lucilla Lame, MD;  Location: [facility];  Service: Endoscopy;  Laterality: N/A;   POLYPECTOMY N/A 03/18/2018   Procedure: POLYPECTOMY;  Surgeon: Lucilla Lame, MD;  Location: [place];  Service: Endoscopy;  Laterality: N/A;    Social History   Tobacco Use   Smoking status: Never   Smokeless tobacco: Never  Vaping Use   Vaping Use: Never used  Substance Use Topics   Alcohol use: Yes    Alcohol/week: 6.0 standard drinks of alcohol    Types: 6 Cans of beer per week    Comment: on weekends   Drug use: No     Medication list has been reviewed and updated.  Current Meds  Medication Sig   amLODipine (NORVASC) 10 MG tablet Take 1 tablet (10 mg total) by mouth daily.   irbesartan (AVAPRO) 150 MG tablet Take 1  tablet (150 mg total) by mouth daily.   Nebivolol HCl (BYSTOLIC) 20 MG TABS Take 1 tablet (20 mg total) by mouth daily.   sildenafil (VIAGRA) 100 MG tablet TAKE 0.5-1 TABLETS (50-100 MG TOTAL) BY MOUTH DAILY AS NEEDED FOR ERECTILE DYSFUNCTION.   [DISCONTINUED] albuterol (VENTOLIN HFA) 108 (90 Base) MCG/ACT inhaler Inhale 1-2 puffs into the lungs every 6 (six) hours as needed for wheezing or shortness of breath.   [DISCONTINUED] benzonatate (TESSALON) 100 MG capsule Take 1 capsule (100 mg total) by mouth every 8 (eight) hours.       07/28/2022    9:33 AM 05/19/2022    1:31 PM 11/04/2021    9:35 AM 04/22/2021    9:09 AM  GAD 7 :  Generalized Anxiety Score  Nervous, Anxious, on Edge 0 0 0 0  Control/stop worrying 0 0 0 0  Worry too much - different things 0 0 0 0  Trouble relaxing 0 0 0 0  Restless 0 0 0 0  Easily annoyed or irritable 0 0 0 0  Afraid - awful might happen 0 0 0 0  Total GAD 7 Score 0 0 0 0  Anxiety Difficulty Not difficult at all Not difficult at all         07/28/2022    9:31 AM 05/19/2022    1:31 PM 11/04/2021    9:35 AM  Depression screen PHQ 2/9  Decreased Interest 0 0 0  Down, Depressed, Hopeless 0 0 0  PHQ - 2 Score 0 0 0  Altered sleeping 0 0 0  Tired, decreased energy 0 0 0  Change in appetite 0 0 0  Feeling bad or failure about yourself  0 0 0  Trouble concentrating 0 0 0  Moving slowly or fidgety/restless 0 0 0  Suicidal thoughts 0 0 0  PHQ-9 Score 0 0 0  Difficult doing work/chores Not difficult at all Not difficult at all Not difficult at all    BP Readings from Last 3 Encounters:  07/28/22 126/84  06/18/22 (!) 156/92  05/27/22 (!) 168/96    Physical Exam Vitals and nursing note reviewed.  Constitutional:      General: He is not in acute distress.    Appearance: He is well-developed.  HENT:     Head: Normocephalic and atraumatic.  Cardiovascular:     Rate and Rhythm: Normal rate and regular rhythm.  Pulmonary:     Effort: Pulmonary effort is normal. No respiratory distress.     Breath sounds: No wheezing or rhonchi.  Musculoskeletal:     Cervical back: Normal range of motion.     Right lower leg: No edema.     Left lower leg: No edema.  Lymphadenopathy:     Cervical: No cervical adenopathy.  Skin:    General: Skin is warm and dry.     Findings: No rash.  Neurological:     Mental Status: He is alert and oriented to person, place, and time.  Psychiatric:        Mood and Affect: Mood normal.        Behavior: Behavior normal.     Wt Readings from Last 3 Encounters:  07/28/22 (!) 323 lb (146.5 kg)  05/27/22 (!) 309 lb 11.9 oz (140.5 kg)  05/19/22 (!) 309  lb 12.8 oz (140.5 kg)    BP 126/84   Pulse 60   Ht '5\' 11"'$  (1.803 m)   Wt (!) 323 lb (146.5 kg)   SpO2 97%  BMI 45.05 kg/m   Assessment and Plan: Problem List Items Addressed This Visit       Cardiovascular and Mediastinum   Hypertension - Primary (Chronic)    Clinically stable exam on irbesartan, Bystolic and amlodipine with controlled BP today Tolerating medications without side effects at this time. Continue current medications - all refilled        Relevant Medications   Nebivolol HCl (BYSTOLIC) 20 MG TABS   amLODipine (NORVASC) 10 MG tablet   irbesartan (AVAPRO) 150 MG tablet     Digestive   Hepatitis C, chronic (HCC)    Now seeing Dr. Allen Norris for treatment Planning to start Epclusa as the preferred on his insurance      Other Visit Diagnoses     Hematospermia       noted once last week He will monitor for recurrence and return for evaluation if needed        Partially dictated using Dragon software. Any errors are unintentional.  Halina Maidens, MD [facility]  07/28/2022

## 2022-07-28 NOTE — Assessment & Plan Note (Signed)
Now seeing Dr. Allen Norris for treatment Planning to start Epclusa as the preferred on his insurance

## 2022-07-28 NOTE — Assessment & Plan Note (Addendum)
Clinically stable exam on irbesartan, Bystolic and amlodipine with controlled BP today Tolerating medications without side effects at this time. Continue current medications - all refilled

## 2022-08-06 ENCOUNTER — Telehealth: Payer: Self-pay | Admitting: Gastroenterology

## 2022-08-06 NOTE — Telephone Encounter (Signed)
Patient calling following up with his medications and the expenses. Requesting call back.

## 2022-08-13 NOTE — Telephone Encounter (Signed)
Received fax of approval for Epclusa... Pt is aware and phone number given to pt to call so the Rx can be shipped... pt expressed understanding

## 2022-10-12 DIAGNOSIS — N529 Male erectile dysfunction, unspecified: Secondary | ICD-10-CM | POA: Diagnosis not present

## 2022-10-12 DIAGNOSIS — B182 Chronic viral hepatitis C: Secondary | ICD-10-CM | POA: Diagnosis not present

## 2022-10-12 DIAGNOSIS — Z8249 Family history of ischemic heart disease and other diseases of the circulatory system: Secondary | ICD-10-CM | POA: Diagnosis not present

## 2022-10-12 DIAGNOSIS — Z6841 Body Mass Index (BMI) 40.0 and over, adult: Secondary | ICD-10-CM | POA: Diagnosis not present

## 2022-10-12 DIAGNOSIS — I10 Essential (primary) hypertension: Secondary | ICD-10-CM | POA: Diagnosis not present

## 2022-10-16 ENCOUNTER — Ambulatory Visit (INDEPENDENT_AMBULATORY_CARE_PROVIDER_SITE_OTHER): Payer: 59

## 2022-10-16 ENCOUNTER — Ambulatory Visit
Admission: EM | Admit: 2022-10-16 | Discharge: 2022-10-16 | Disposition: A | Discharge status: Home / Self Care | Payer: 59 | Attending: Family Medicine | Admitting: Family Medicine

## 2022-10-16 DIAGNOSIS — J9811 Atelectasis: Secondary | ICD-10-CM | POA: Diagnosis not present

## 2022-10-16 DIAGNOSIS — R0902 Hypoxemia: Secondary | ICD-10-CM | POA: Diagnosis not present

## 2022-10-16 DIAGNOSIS — J209 Acute bronchitis, unspecified: Secondary | ICD-10-CM

## 2022-10-16 DIAGNOSIS — R059 Cough, unspecified: Secondary | ICD-10-CM | POA: Diagnosis not present

## 2022-10-16 DIAGNOSIS — R509 Fever, unspecified: Secondary | ICD-10-CM | POA: Diagnosis not present

## 2022-10-16 MED ORDER — HYDROCOD POLI-CHLORPHE POLI ER 10-8 MG/5ML PO SUER: 5.0000 mL | Freq: Two times a day (BID) | ORAL | 0 refills | Status: DC | PRN

## 2022-10-16 MED ORDER — PREDNISONE 50 MG PO TABS: 50.0000 mg | ORAL_TABLET | Freq: Every day | ORAL | 0 refills | Status: AC

## 2022-10-16 MED ORDER — AMOXICILLIN-POT CLAVULANATE 875-125 MG PO TABS: 1.0000 | ORAL_TABLET | Freq: Two times a day (BID) | ORAL | 0 refills | Status: DC

## 2022-10-16 MED ORDER — BENZONATATE 100 MG PO CAPS: 200.0000 mg | ORAL_CAPSULE | Freq: Three times a day (TID) | ORAL | 0 refills | Status: DC | PRN

## 2022-10-16 NOTE — ED Triage Notes (Signed)
Patient presents to UC for cough, chest burning when coughing, and nasal congestion x 1 week. Treating symptoms with nyquil.
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
## 2022-10-16 NOTE — Discharge Instructions (Addendum)
Your chest x-ray showed evidence of bronchitis but not pneumonia.  Stop by the pharmacy to pick up your steroids and antibiotics.  While taking steroids, monitor your blood pressure as it was quite elevated here.  Be sure to take your blood pressure medications daily.  Follow-up with your PCP regarding elevated blood pressures.

## 2022-11-28 ENCOUNTER — Ambulatory Visit (INDEPENDENT_AMBULATORY_CARE_PROVIDER_SITE_OTHER): Payer: 59 | Admitting: Internal Medicine

## 2022-11-28 ENCOUNTER — Encounter: Payer: Self-pay | Admitting: Internal Medicine

## 2022-11-28 ENCOUNTER — Telehealth: Payer: Self-pay

## 2022-11-28 VITALS — BP 128/78 | HR 62 | Ht 71.0 in | Wt 330.0 lb

## 2022-11-28 DIAGNOSIS — Z125 Encounter for screening for malignant neoplasm of prostate: Secondary | ICD-10-CM

## 2022-11-28 DIAGNOSIS — Z6841 Body Mass Index (BMI) 40.0 and over, adult: Secondary | ICD-10-CM | POA: Diagnosis not present

## 2022-11-28 DIAGNOSIS — Z0000 Encounter for general adult medical examination without abnormal findings: Secondary | ICD-10-CM | POA: Diagnosis not present

## 2022-11-28 DIAGNOSIS — Z1211 Encounter for screening for malignant neoplasm of colon: Secondary | ICD-10-CM

## 2022-11-28 DIAGNOSIS — R058 Other specified cough: Secondary | ICD-10-CM | POA: Diagnosis not present

## 2022-11-28 DIAGNOSIS — R7303 Prediabetes: Secondary | ICD-10-CM

## 2022-11-28 DIAGNOSIS — I10 Essential (primary) hypertension: Secondary | ICD-10-CM | POA: Diagnosis not present

## 2022-11-28 DIAGNOSIS — B182 Chronic viral hepatitis C: Secondary | ICD-10-CM | POA: Diagnosis not present

## 2022-11-28 DIAGNOSIS — E785 Hyperlipidemia, unspecified: Secondary | ICD-10-CM

## 2022-11-28 MED ORDER — ALBUTEROL SULFATE HFA 108 (90 BASE) MCG/ACT IN AERS: 2.0000 | INHALATION_SPRAY | Freq: Four times a day (QID) | RESPIRATORY_TRACT | 0 refills | Status: DC | PRN

## 2022-11-28 NOTE — Telephone Encounter (Signed)
Called pt, no answer and VM not set up 

## 2022-11-28 NOTE — Telephone Encounter (Signed)
This patient was started on Hep C treatment. He is not sure how long he should take it. It looks to me like he started on 10/13/22 and was to take it for 8 weeks. He says he is on the third or fourth bottle? please have someone call him to clarify his treatment course. Thanks

## 2022-11-28 NOTE — Patient Instructions (Signed)
-  It was a pleasure to see you today! Please review your visit summary for helpful information. -Lab results are usually available within 1-2 days and we will call once reviewed. -I would encourage you to follow your care via MyChart where you can access lab results, notes, messages, and more. -If you feel that we did a nice job today, please complete your after-visit survey and leave us a Google review! Your CMA today was Chassidy and your provider was Dr Nedda Gains, MD.  

## 2022-11-28 NOTE — Progress Notes (Signed)
Date:  11/28/2022   Name:  Andrew Hodge   DOB:  1960/11/17   MRN:  [id number]   Chief Complaint: Annual Exam Andrew Hodge is a 62 y.o. male who presents today for his Complete Annual Exam. He feels well. He reports exercising - not at this time. He reports he is sleeping well.   Colonoscopy: 02/2018 repeat 5 yrs Dr. Servando Snare  Immunization History  Administered Date(s) Administered   PFIZER(Purple Top)SARS-COV-2 Vaccination 10/28/2019, 11/22/2019   Tdap 12/12/2015   Health Maintenance Due  Topic Date Due   Zoster Vaccines- Shingrix (1 of 2) Never done   COVID-19 Vaccine (3 - 2023-24 season) 03/21/2022    Lab Results  Component Value Date   PSA1 0.2 11/04/2021   PSA1 0.3 10/30/2020   PSA1 0.2 10/26/2018    Hypertension This is a chronic problem. The problem is controlled. Pertinent negatives include no chest pain, headaches, palpitations or shortness of breath. Past treatments include angiotensin blockers, beta blockers and calcium channel blockers. The current treatment provides significant improvement. There is no history of kidney disease, CAD/MI or CVA.  Hep C - he is on Epclusa but is unsure about the length of treatment.  I will message GI and have them reach out to him.   Lab Results  Component Value Date   NA 141 05/15/2022   K 4.0 05/15/2022   CO2 22 05/15/2022   GLUCOSE 132 (H) 05/15/2022   BUN 10 05/15/2022   CREATININE 0.83 05/15/2022   CALCIUM 9.3 05/15/2022   EGFR 100 05/15/2022   GFRNONAA >60 10/27/2019   Lab Results  Component Value Date   CHOL 210 (H) 11/04/2021   HDL 49 11/04/2021   LDLCALC 132 (H) 11/04/2021   TRIG 161 (H) 11/04/2021   CHOLHDL 4.3 11/04/2021   Lab Results  Component Value Date   TSH 4.020 10/26/2018   Lab Results  Component Value Date   HGBA1C 5.8 (H) 11/04/2021   Lab Results  Component Value Date   WBC 4.3 11/04/2021   HGB 16.6 11/04/2021   HCT 47.9 11/04/2021   MCV 87 11/04/2021   PLT 237 11/04/2021   Lab  Results  Component Value Date   ALT 53 (H) 05/15/2022   AST 31 05/15/2022   ALKPHOS 77 05/15/2022   BILITOT 0.4 05/15/2022   No results found for: "25OHVITD2", "25OHVITD3", "VD25OH"   Review of Systems  Constitutional:  Negative for appetite change, chills, diaphoresis, fatigue and unexpected weight change.  HENT:  Negative for hearing loss, tinnitus, trouble swallowing and voice change.   Eyes:  Negative for visual disturbance.  Respiratory:  Negative for choking, shortness of breath and wheezing.   Cardiovascular:  Negative for chest pain, palpitations and leg swelling.  Gastrointestinal:  Negative for abdominal pain, blood in stool, constipation and diarrhea.  Genitourinary:  Negative for difficulty urinating, dysuria and frequency.  Musculoskeletal:  Negative for arthralgias, back pain and myalgias.  Skin:  Negative for color change and rash.  Neurological:  Negative for dizziness, syncope and headaches.  Hematological:  Negative for adenopathy.  Psychiatric/Behavioral:  Negative for dysphoric mood and sleep disturbance. The patient is not nervous/anxious.     Patient Active Problem List   Diagnosis Date Noted   Benign neoplasm of transverse colon    Hepatitis C, chronic ([facility]) 10/24/2017   Primary hypertension 07/08/2016   Obesity, morbid ([facility]) 06/26/2016    No Known Allergies  Past Surgical History:  Procedure Laterality Date   CLOSED  MANIPULATION SHOULDER     dislocated shoulder - age 75   COLONOSCOPY WITH PROPOFOL N/A 03/18/2018   Procedure: COLONOSCOPY WITH PROPOFOL;  Surgeon: Midge Minium, MD;  Location[facility];  Service: Endoscopy;  Laterality: N/A;   POLYPECTOMY N/A 03/18/2018   Procedure: POLYPECTOMY;  Surgeon: Midge Minium, MD;  Location: [facility];  Service: Endoscopy;  Laterality: N/A;    Social History   Tobacco Use   Smoking status: Never   Smokeless tobacco: Never  Vaping Use   Vaping Use: Never used  Substance Use Topics    Alcohol use: Yes    Alcohol/week: 6.0 standard drinks of alcohol    Types: 6 Cans of beer per week    Comment: on weekends   Drug use: No     Medication list has been reviewed and updated.  Current Meds  Medication Sig   albuterol (VENTOLIN HFA) 108 (90 Base) MCG/ACT inhaler Inhale 2 puffs into the lungs every 6 (six) hours as needed for wheezing or shortness of breath.   amLODipine (NORVASC) 10 MG tablet Take 1 tablet (10 mg total) by mouth daily.   EPCLUSA 400-100 MG TABS Take 1 tablet by mouth daily.   irbesartan (AVAPRO) 150 MG tablet Take 1 tablet (150 mg total) by mouth daily.   Nebivolol HCl (BYSTOLIC) 20 MG TABS Take 1 tablet (20 mg total) by mouth daily.   sildenafil (VIAGRA) 100 MG tablet TAKE 0.5-1 TABLETS (50-100 MG TOTAL) BY MOUTH DAILY AS NEEDED FOR ERECTILE DYSFUNCTION.       07/28/2022    9:33 AM 05/19/2022    1:31 PM 11/04/2021    9:35 AM 04/22/2021    9:09 AM  GAD 7 : Generalized Anxiety Score  Nervous, Anxious, on Edge 0 0 0 0  Control/stop worrying 0 0 0 0  Worry too much - different things 0 0 0 0  Trouble relaxing 0 0 0 0  Restless 0 0 0 0  Easily annoyed or irritable 0 0 0 0  Afraid - awful might happen 0 0 0 0  Total GAD 7 Score 0 0 0 0  Anxiety Difficulty Not difficult at all Not difficult at all         07/28/2022    9:31 AM 05/19/2022    1:31 PM 11/04/2021    9:35 AM  Depression screen PHQ 2/9  Decreased Interest 0 0 0  Down, Depressed, Hopeless 0 0 0  PHQ - 2 Score 0 0 0  Altered sleeping 0 0 0  Tired, decreased energy 0 0 0  Change in appetite 0 0 0  Feeling bad or failure about yourself  0 0 0  Trouble concentrating 0 0 0  Moving slowly or fidgety/restless 0 0 0  Suicidal thoughts 0 0 0  PHQ-9 Score 0 0 0  Difficult doing work/chores Not difficult at all Not difficult at all Not difficult at all    BP Readings from Last 3 Encounters:  11/28/22 128/78  10/16/22 (S) (!) 171/114  07/28/22 126/84    Physical Exam Vitals and nursing  note reviewed.  Constitutional:      Appearance: Normal appearance. He is well-developed.  HENT:     Head: Normocephalic.     Right Ear: Tympanic membrane, ear canal and external ear normal.     Left Ear: Tympanic membrane, ear canal and external ear normal.     Nose: Nose normal.  Eyes:     Conjunctiva/sclera: Conjunctivae normal.  Pupils: Pupils are equal, round, and reactive to light.  Neck:     Thyroid: No thyromegaly.     Vascular: No carotid bruit.  Cardiovascular:     Rate and Rhythm: Normal rate and regular rhythm.     Heart sounds: Normal heart sounds.  Pulmonary:     Effort: Pulmonary effort is normal.     Breath sounds: Normal breath sounds. No wheezing.  Chest:  Breasts:    Right: No mass.     Left: No mass.  Abdominal:     General: Bowel sounds are normal.     Palpations: Abdomen is soft.     Tenderness: There is no abdominal tenderness.  Musculoskeletal:        General: Normal range of motion.     Cervical back: Normal range of motion and neck supple.  Lymphadenopathy:     Cervical: No cervical adenopathy.  Skin:    General: Skin is warm and dry.  Neurological:     Mental Status: He is alert and oriented to person, place, and time.     Deep Tendon Reflexes: Reflexes are normal and symmetric.  Psychiatric:        Attention and Perception: Attention normal.        Mood and Affect: Mood normal.        Thought Content: Thought content normal.     Wt Readings from Last 3 Encounters:  11/28/22 (!) 330 lb (149.7 kg)  07/28/22 (!) 323 lb (146.5 kg)  05/27/22 (!) 309 lb 11.9 oz (140.5 kg)    BP 128/78   Pulse 62   Ht 5\' 11"  (1.803 m)   Wt (!) 330 lb (149.7 kg)   SpO2 96%   BMI 46.03 kg/m   Assessment and Plan:  Problem List Items Addressed This Visit       Cardiovascular and Mediastinum   Primary hypertension    Stable exam with well controlled BP.  Currently taking amlodipine, irbesartan and bystolic. Tolerating medications without concerns  or side effects. Will continue to recommend low sodium diet and current regimen.       Relevant Orders   CBC with Differential/Platelet   Comprehensive metabolic panel     Digestive   Hepatitis C, chronic (HCC)    Currently on Epclusa - unsure how long he should continue to take it. Will contact GI for clarificiation      Other Visit Diagnoses     Annual physical exam    -  Primary   Relevant Orders   CBC with Differential/Platelet   Comprehensive metabolic panel   Hemoglobin A1c   Lipid panel   PSA   Colon cancer screening       he is due for 5 yr repeat He wants to defer to next year   Prostate cancer screening       DRE deferred to lack of symptoms   Relevant Orders   PSA   Prediabetes       Relevant Orders   Hemoglobin A1c   Mild hyperlipidemia       Relevant Orders   Lipid panel   BMI 40.0-44.9, adult (HCC)   (Chronic)     Allergic cough       Relevant Medications   albuterol (VENTOLIN HFA) 108 (90 Base) MCG/ACT inhaler       Return in about 6 months (around 05/31/2023) for HTN.   Partially dictated using Dragon software, any errors are not intentional.  Reubin Milan, MD [facility] [place], [place]

## 2022-11-28 NOTE — Assessment & Plan Note (Signed)
Currently on Epclusa - unsure how long he should continue to take it. Will contact GI for clarificiation

## 2022-11-28 NOTE — Assessment & Plan Note (Signed)
Stable exam with well controlled BP.  Currently taking amlodipine, irbesartan and bystolic. Tolerating medications without concerns or side effects. Will continue to recommend low sodium diet and current regimen.

## 2022-11-29 LAB — CBC WITH DIFFERENTIAL/PLATELET
Basophils Absolute: 0 10*3/uL (ref 0.0–0.2)
Basos: 1 %
EOS (ABSOLUTE): 0.2 10*3/uL (ref 0.0–0.4)
Eos: 4 %
Hematocrit: 44.8 % (ref 37.5–51.0)
Hemoglobin: 15.3 g/dL (ref 13.0–17.7)
Immature Grans (Abs): 0 10*3/uL (ref 0.0–0.1)
Immature Granulocytes: 1 %
Lymphocytes Absolute: 1.7 10*3/uL (ref 0.7–3.1)
Lymphs: 32 %
MCH: 29.4 pg (ref 26.6–33.0)
MCHC: 34.2 g/dL (ref 31.5–35.7)
MCV: 86 fL (ref 79–97)
Monocytes Absolute: 0.7 10*3/uL (ref 0.1–0.9)
Monocytes: 13 %
Neutrophils Absolute: 2.6 10*3/uL (ref 1.4–7.0)
Neutrophils: 49 %
Platelets: 231 10*3/uL (ref 150–450)
RBC: 5.21 x10E6/uL (ref 4.14–5.80)
RDW: 12.2 % (ref 11.6–15.4)
WBC: 5.2 10*3/uL (ref 3.4–10.8)

## 2022-11-29 LAB — LIPID PANEL
Chol/HDL Ratio: 5.3 ratio — ABNORMAL HIGH (ref 0.0–5.0)
Cholesterol, Total: 217 mg/dL — ABNORMAL HIGH (ref 100–199)
HDL: 41 mg/dL (ref >39)
LDL Chol Calc (NIH): 147 mg/dL — ABNORMAL HIGH (ref 0–99)
Triglycerides: 162 mg/dL — ABNORMAL HIGH (ref 0–149)
VLDL Cholesterol Cal: 29 mg/dL (ref 5–40)

## 2022-11-29 LAB — COMPREHENSIVE METABOLIC PANEL
ALT: 24 IU/L (ref 0–44)
AST: 19 IU/L (ref 0–40)
Albumin/Globulin Ratio: 1.4 (ref 1.2–2.2)
Albumin: 4.2 g/dL (ref 3.9–4.9)
Alkaline Phosphatase: 85 IU/L (ref 44–121)
BUN/Creatinine Ratio: 11 (ref 10–24)
BUN: 10 mg/dL (ref 8–27)
Bilirubin Total: 0.4 mg/dL (ref 0.0–1.2)
CO2: 19 mmol/L — ABNORMAL LOW (ref 20–29)
Calcium: 9.3 mg/dL (ref 8.6–10.2)
Chloride: 102 mmol/L (ref 96–106)
Creatinine, Ser: 0.9 mg/dL (ref 0.76–1.27)
Globulin, Total: 2.9 g/dL (ref 1.5–4.5)
Glucose: 113 mg/dL — ABNORMAL HIGH (ref 70–99)
Potassium: 4 mmol/L (ref 3.5–5.2)
Sodium: 137 mmol/L (ref 134–144)
Total Protein: 7.1 g/dL (ref 6.0–8.5)
eGFR: 97 mL/min/{1.73_m2} (ref >59)

## 2022-11-29 LAB — PSA: Prostate Specific Ag, Serum: 0.2 ng/mL (ref 0.0–4.0)

## 2022-11-29 LAB — HEMOGLOBIN A1C
Est. average glucose Bld gHb Est-mCnc: 151 mg/dL
Hgb A1c MFr Bld: 6.9 % — ABNORMAL HIGH (ref 4.8–5.6)

## 2022-12-01 ENCOUNTER — Telehealth: Payer: Self-pay | Admitting: Gastroenterology

## 2022-12-01 NOTE — Telephone Encounter (Signed)
Pt LVMM returning call --

## 2022-12-03 NOTE — Telephone Encounter (Signed)
Called pt no answer and VM not set up 

## 2022-12-04 NOTE — Telephone Encounter (Signed)
Called pt no answer and VM not set up 

## 2023-03-16 ENCOUNTER — Ambulatory Visit
Admission: EM | Admit: 2023-03-16 | Discharge: 2023-03-16 | Disposition: A | Discharge status: Home / Self Care | Payer: 59 | Attending: Internal Medicine | Admitting: Internal Medicine

## 2023-03-16 DIAGNOSIS — J209 Acute bronchitis, unspecified: Secondary | ICD-10-CM | POA: Diagnosis not present

## 2023-03-16 MED ORDER — PREDNISONE 20 MG PO TABS: 40.0000 mg | ORAL_TABLET | Freq: Every day | ORAL | 0 refills | Status: AC

## 2023-03-16 MED ORDER — ALBUTEROL SULFATE HFA 108 (90 BASE) MCG/ACT IN AERS: 1.0000 | INHALATION_SPRAY | Freq: Four times a day (QID) | RESPIRATORY_TRACT | 0 refills | Status: AC | PRN

## 2023-03-16 MED ORDER — PROMETHAZINE-DM 6.25-15 MG/5ML PO SYRP: 5.0000 mL | ORAL_SOLUTION | Freq: Four times a day (QID) | ORAL | 0 refills | Status: DC | PRN

## 2023-03-16 MED ORDER — AMOXICILLIN-POT CLAVULANATE 875-125 MG PO TABS: 1.0000 | ORAL_TABLET | Freq: Two times a day (BID) | ORAL | 0 refills | Status: DC

## 2023-03-16 NOTE — ED Triage Notes (Signed)
Pt c/o cough x7 days. Concerned about poss bronchitis. Denies any wheezing or SOB. No otc tx.

## 2023-03-16 NOTE — Discharge Instructions (Signed)
Start Augmentin twice daily for 7 days.  Prednisone daily for 5 days and you may take Promethazine DM as needed for your cough.  Please note the cough medicine can make you drowsy.  Do not drink alcohol or drive while on this medication.  Albuterol inhaler as needed for wheezing.  Lots of rest and fluids.  Please follow-up with your PCP if your symptoms do not improve.  Please go to emergency room for any worsening symptoms.  I hope you feel better soon!
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
## 2023-04-14 ENCOUNTER — Ambulatory Visit
Admission: EM | Admit: 2023-04-14 | Discharge: 2023-04-14 | Disposition: A | Discharge status: Home / Self Care | Payer: 59 | Attending: Physician Assistant | Admitting: Physician Assistant

## 2023-04-14 DIAGNOSIS — B351 Tinea unguium: Secondary | ICD-10-CM

## 2023-04-14 DIAGNOSIS — M79674 Pain in right toe(s): Secondary | ICD-10-CM | POA: Diagnosis not present

## 2023-04-14 NOTE — ED Triage Notes (Signed)
Pt c/o R big toe pain x4 days. States cut nail wrong & is growing back the wrong way.

## 2023-04-14 NOTE — Discharge Instructions (Addendum)
-  Please follow up with podiatry.
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
## 2023-04-16 ENCOUNTER — Other Ambulatory Visit: Payer: Self-pay | Admitting: Internal Medicine

## 2023-04-16 DIAGNOSIS — I10 Essential (primary) hypertension: Secondary | ICD-10-CM

## 2023-04-16 NOTE — Telephone Encounter (Signed)
Requested Prescriptions  Pending Prescriptions Disp Refills   irbesartan (AVAPRO) 150 MG tablet [Pharmacy Med Name: Irbesartan 150 MG Oral Tablet] 90 tablet 0    Sig: Take 1 tablet by mouth once daily     Cardiovascular:  Angiotensin Receptor Blockers Passed - 04/16/2023 12:10 PM      Passed - Cr in normal range and within 180 days    Creatinine, Ser  Date Value Ref Range Status  11/28/2022 0.90 0.76 - 1.27 mg/dL Final         Passed - K in normal range and within 180 days    Potassium  Date Value Ref Range Status  11/28/2022 4.0 3.5 - 5.2 mmol/L Final         Passed - Patient is not pregnant      Passed - Last BP in normal range    BP Readings from Last 1 Encounters:  04/14/23 136/75         Passed - Valid encounter within last 6 months    Recent Outpatient Visits           4 months ago Annual physical exam   [place] [facility] at [facility], Nyoka Cowden, MD   8 months ago Primary hypertension   [place] [facility] at [facility], Nyoka Cowden, MD   11 months ago Primary hypertension   [place] [facility] at [facility], Nyoka Cowden, MD   1 year ago Annual physical exam   [facility] at [facility], Nyoka Cowden, MD   1 year ago Primary hypertension   [facility] at [facility], Nyoka Cowden, MD

## 2023-05-15 ENCOUNTER — Other Ambulatory Visit: Payer: Self-pay | Admitting: Internal Medicine

## 2023-05-15 DIAGNOSIS — I10 Essential (primary) hypertension: Secondary | ICD-10-CM

## 2023-05-15 NOTE — Telephone Encounter (Signed)
Medication Refill - Medication: amLODipine (NORVASC) 10 MG tablet , irbesartan (AVAPRO) 150 MG tablet , Nebivolol HCl (BYSTOLIC) 20 MG TABS   Has the patient contacted their pharmacy? Yes.    (Agent: If yes, when and what did the pharmacy advise?) No refills  Preferred Pharmacy (with phone number or street name):  [facility] [address] [place] - [address] Phone: [phone]  Fax: [phone]      Has the patient been seen for an appointment in the last year OR does the patient have an upcoming appointment? Yes.    Agent: Please be advised that RX refills may take up to 3 business days. We ask that you follow-up with your pharmacy.

## 2023-05-18 MED ORDER — NEBIVOLOL HCL 20 MG PO TABS: 20.0000 mg | ORAL_TABLET | Freq: Every day | ORAL | 0 refills | Status: DC

## 2023-05-18 MED ORDER — IRBESARTAN 150 MG PO TABS: 150.0000 mg | ORAL_TABLET | Freq: Every day | ORAL | 0 refills | Status: DC

## 2023-05-18 MED ORDER — AMLODIPINE BESYLATE 10 MG PO TABS: 10.0000 mg | ORAL_TABLET | Freq: Every day | ORAL | 0 refills | Status: DC

## 2023-05-18 NOTE — Telephone Encounter (Signed)
Requested Prescriptions  Pending Prescriptions Disp Refills   amLODipine (NORVASC) 10 MG tablet 90 tablet 0    Sig: Take 1 tablet (10 mg total) by mouth daily.     Cardiovascular: Calcium Channel Blockers 2 Passed - 05/15/2023  1:05 PM      Passed - Last BP in normal range    BP Readings from Last 1 Encounters:  04/14/23 136/75         Passed - Last Heart Rate in normal range    Pulse Readings from Last 1 Encounters:  04/14/23 (!) 59         Passed - Valid encounter within last 6 months    Recent Outpatient Visits           5 months ago Annual physical exam   [place] [facility] at [facility], Nyoka Cowden, MD   9 months ago Primary hypertension   [place] [facility] at [facility], Nyoka Cowden, MD   12 months ago Primary hypertension   [place] [facility] at [facility], Nyoka Cowden, MD   1 year ago Annual physical exam   [facility] at [facility], Nyoka Cowden, MD   2 years ago Primary hypertension   [place] [facility] at [facility], Nyoka Cowden, MD       Future Appointments             In 3 weeks Reubin Milan, MD [facility] at [facility]             irbesartan (AVAPRO) 150 MG tablet 90 tablet 0    Sig: Take 1 tablet (150 mg total) by mouth daily.     Cardiovascular:  Angiotensin Receptor Blockers Passed - 05/15/2023  1:05 PM      Passed - Cr in normal range and within 180 days    Creatinine, Ser  Date Value Ref Range Status  11/28/2022 0.90 0.76 - 1.27 mg/dL Final         Passed - K in normal range and within 180 days    Potassium  Date Value Ref Range Status  11/28/2022 4.0 3.5 - 5.2 mmol/L Final         Passed - Patient is not pregnant      Passed - Last BP in normal range    BP Readings from Last 1 Encounters:  04/14/23 136/75          Passed - Valid encounter within last 6 months    Recent Outpatient Visits           5 months ago Annual physical exam   [place] [facility] at [facility], Nyoka Cowden, MD   9 months ago Primary hypertension   [place] [facility] at [facility], Nyoka Cowden, MD   12 months ago Primary hypertension   [place] [facility] at [facility], Nyoka Cowden, MD   1 year ago Annual physical exam   [facility] at [facility], Nyoka Cowden, MD   2 years ago Primary hypertension    Primary Care [facility] Sports Medicine at [facility], Nyoka Cowden, MD       Future Appointments  In 3 weeks Reubin Milan, MD [facility] at [facility]             Nebivolol HCl (BYSTOLIC) 20 MG TABS 90 tablet 0    Sig: Take 1 tablet (20 mg total) by mouth daily.     Cardiovascular: Beta Blockers 3 Passed - 05/15/2023  1:05 PM      Passed - Cr in normal range and within 360 days    Creatinine, Ser  Date Value Ref Range Status  11/28/2022 0.90 0.76 - 1.27 mg/dL Final         Passed - AST in normal range and within 360 days    AST  Date Value Ref Range Status  11/28/2022 19 0 - 40 IU/L Final         Passed - ALT in normal range and within 360 days    ALT  Date Value Ref Range Status  11/28/2022 24 0 - 44 IU/L Final   ALT (SGPT) P5P  Date Value Ref Range Status  05/15/2022 58 (H) 0 - 55 IU/L Final         Passed - Last BP in normal range    BP Readings from Last 1 Encounters:  04/14/23 136/75         Passed - Last Heart Rate in normal range    Pulse Readings from Last 1 Encounters:  04/14/23 (!) 59         Passed - Valid encounter within last 6 months    Recent Outpatient Visits           5 months ago Annual physical exam   [facility] at  [facility], Nyoka Cowden, MD   9 months ago Primary hypertension   [place] [facility] at [facility], Nyoka Cowden, MD   12 months ago Primary hypertension   [place] [facility] at [facility], Nyoka Cowden, MD   1 year ago Annual physical exam   [facility] at [facility], Nyoka Cowden, MD   2 years ago Primary hypertension   [place] [facility] at [facility], Nyoka Cowden, MD       Future Appointments             In 3 weeks Judithann Graves Nyoka Cowden, MD [facility] Medicine at [facility], [facility]

## 2023-06-12 ENCOUNTER — Other Ambulatory Visit
Admission: RE | Admit: 2023-06-12 | Discharge: 2023-06-12 | Disposition: A | Discharge status: Home / Self Care | Payer: 59 | Attending: Internal Medicine | Admitting: Internal Medicine

## 2023-06-12 ENCOUNTER — Encounter: Payer: Self-pay | Admitting: Internal Medicine

## 2023-06-12 ENCOUNTER — Ambulatory Visit (INDEPENDENT_AMBULATORY_CARE_PROVIDER_SITE_OTHER): Payer: 59 | Admitting: Internal Medicine

## 2023-06-12 VITALS — BP 122/72 | HR 55 | Ht 71.0 in | Wt 340.0 lb

## 2023-06-12 DIAGNOSIS — R7303 Prediabetes: Secondary | ICD-10-CM | POA: Insufficient documentation

## 2023-06-12 DIAGNOSIS — I10 Essential (primary) hypertension: Secondary | ICD-10-CM | POA: Diagnosis not present

## 2023-06-12 LAB — COMPREHENSIVE METABOLIC PANEL
ALT: 21 U/L (ref 0–44)
AST: 18 U/L (ref 15–41)
Albumin: 4.1 g/dL (ref 3.5–5.0)
Alkaline Phosphatase: 66 U/L (ref 38–126)
Anion gap: 6 (ref 5–15)
BUN: 13 mg/dL (ref 8–23)
CO2: 25 mmol/L (ref 22–32)
Calcium: 9.1 mg/dL (ref 8.9–10.3)
Chloride: 103 mmol/L (ref 98–111)
Creatinine, Ser: 0.88 mg/dL (ref 0.61–1.24)
GFR, Estimated: >60 mL/min (ref >60)
Glucose, Bld: 123 mg/dL — ABNORMAL HIGH (ref 70–99)
Potassium: 3.8 mmol/L (ref 3.5–5.1)
Sodium: 134 mmol/L — ABNORMAL LOW (ref 135–145)
Total Bilirubin: 0.7 mg/dL (ref <1.2)
Total Protein: 7.8 g/dL (ref 6.5–8.1)

## 2023-06-12 LAB — HEMOGLOBIN A1C
Hgb A1c MFr Bld: 5.7 % — ABNORMAL HIGH (ref 4.8–5.6)
Mean Plasma Glucose: 116.89 mg/dL

## 2023-06-12 MED ORDER — NEBIVOLOL HCL 20 MG PO TABS: 20.0000 mg | ORAL_TABLET | Freq: Every day | ORAL | 1 refills | Status: DC

## 2023-06-12 MED ORDER — IRBESARTAN 150 MG PO TABS: 150.0000 mg | ORAL_TABLET | Freq: Every day | ORAL | 1 refills | Status: DC

## 2023-06-12 MED ORDER — AMLODIPINE BESYLATE 10 MG PO TABS: 10.0000 mg | ORAL_TABLET | Freq: Every day | ORAL | 1 refills | Status: DC

## 2023-06-12 NOTE — Progress Notes (Signed)
Date:  06/12/2023   Name:  Andrew Hodge   DOB:  September 04, 1960   MRN:  [id number]   Chief Complaint: Diabetes and Hypertension  Diabetes He presents for his initial diabetic visit. He has type 2 diabetes mellitus. The initial diagnosis of diabetes was made 6 months ago. Pertinent negatives for hypoglycemia include no dizziness, headaches, hunger, nervousness/anxiousness, sleepiness or sweats. Pertinent negatives for diabetes include no fatigue. When asked about current treatments, none (he is not convinced that his BS is abnormal) were reported. He is compliant with treatment none of the time.  Hypertension This is a chronic problem. The problem is controlled. Pertinent negatives include no headaches, shortness of breath or sweats.    Review of Systems  Constitutional:  Positive for unexpected weight change (has gained 15 lbs). Negative for chills, fatigue and fever.  Eyes:  Negative for visual disturbance.  Respiratory:  Negative for chest tightness, shortness of breath and wheezing.   Cardiovascular:  Negative for leg swelling.  Genitourinary:  Negative for frequency and urgency.  Neurological:  Negative for dizziness and headaches.  Psychiatric/Behavioral:  Negative for dysphoric mood and sleep disturbance. The patient is not nervous/anxious.      Lab Results  Component Value Date   NA 137 11/28/2022   K 4.0 11/28/2022   CO2 19 (L) 11/28/2022   GLUCOSE 113 (H) 11/28/2022   BUN 10 11/28/2022   CREATININE 0.90 11/28/2022   CALCIUM 9.3 11/28/2022   EGFR 97 11/28/2022   GFRNONAA >60 10/27/2019   Lab Results  Component Value Date   CHOL 217 (H) 11/28/2022   HDL 41 11/28/2022   LDLCALC 147 (H) 11/28/2022   TRIG 162 (H) 11/28/2022   CHOLHDL 5.3 (H) 11/28/2022   Lab Results  Component Value Date   TSH 4.020 10/26/2018   Lab Results  Component Value Date   HGBA1C 6.9 (H) 11/28/2022   Lab Results  Component Value Date   WBC 5.2 11/28/2022   HGB 15.3 11/28/2022    HCT 44.8 11/28/2022   MCV 86 11/28/2022   PLT 231 11/28/2022   Lab Results  Component Value Date   ALT 24 11/28/2022   AST 19 11/28/2022   ALKPHOS 85 11/28/2022   BILITOT 0.4 11/28/2022   No results found for: "25OHVITD2", "25OHVITD3", "VD25OH"   Patient Active Problem List   Diagnosis Date Noted   Prediabetes 06/12/2023   Benign neoplasm of transverse colon    Hepatitis C, chronic (HCC) 10/24/2017   Primary hypertension 07/08/2016   Obesity, morbid (HCC) 06/26/2016    No Known Allergies  Past Surgical History:  Procedure Laterality Date   CLOSED MANIPULATION SHOULDER     dislocated shoulder - age 24   COLONOSCOPY WITH PROPOFOL N/A 03/18/2018   Procedure: COLONOSCOPY WITH PROPOFOL;  Surgeon: Midge Minium, MD;  Location: [facility];  Service: Endoscopy;  Laterality: N/A;   POLYPECTOMY N/A 03/18/2018   Procedure: POLYPECTOMY;  Surgeon: Midge Minium, MD;  Location: [facility];  Service: Endoscopy;  Laterality: N/A;    Social History   Tobacco Use   Smoking status: Never   Smokeless tobacco: Never  Vaping Use   Vaping status: Never Used  Substance Use Topics   Alcohol use: Yes    Alcohol/week: 6.0 standard drinks of alcohol    Types: 6 Cans of beer per week    Comment: on weekends   Drug use: No     Medication list has been reviewed and updated.  Current Meds  Medication Sig   albuterol (VENTOLIN HFA) 108 (90 Base) MCG/ACT inhaler Inhale 1-2 puffs into the lungs every 6 (six) hours as needed for wheezing or shortness of breath.   sildenafil (VIAGRA) 100 MG tablet TAKE 0.5-1 TABLETS (50-100 MG TOTAL) BY MOUTH DAILY AS NEEDED FOR ERECTILE DYSFUNCTION.   [DISCONTINUED] amLODipine (NORVASC) 10 MG tablet Take 1 tablet (10 mg total) by mouth daily.   [DISCONTINUED] irbesartan (AVAPRO) 150 MG tablet Take 1 tablet (150 mg total) by mouth daily.   [DISCONTINUED] Nebivolol HCl (BYSTOLIC) 20 MG TABS Take 1 tablet (20 mg total) by mouth daily.        06/12/2023    9:50 AM 07/28/2022    9:33 AM 05/19/2022    1:31 PM 11/04/2021    9:35 AM  GAD 7 : Generalized Anxiety Score  Nervous, Anxious, on Edge 0 0 0 0  Control/stop worrying 0 0 0 0  Worry too much - different things 0 0 0 0  Trouble relaxing 0 0 0 0  Restless 0 0 0 0  Easily annoyed or irritable 0 0 0 0  Afraid - awful might happen 0 0 0 0  Total GAD 7 Score 0 0 0 0  Anxiety Difficulty Not difficult at all Not difficult at all Not difficult at all        06/12/2023    9:50 AM 07/28/2022    9:31 AM 05/19/2022    1:31 PM  Depression screen PHQ 2/9  Decreased Interest 0 0 0  Down, Depressed, Hopeless 0 0 0  PHQ - 2 Score 0 0 0  Altered sleeping 0 0 0  Tired, decreased energy 0 0 0  Change in appetite 0 0 0  Feeling bad or failure about yourself  0 0 0  Trouble concentrating 0 0 0  Moving slowly or fidgety/restless 0 0 0  Suicidal thoughts 0 0 0  PHQ-9 Score 0 0 0  Difficult doing work/chores Not difficult at all Not difficult at all Not difficult at all    BP Readings from Last 3 Encounters:  06/12/23 122/72  04/14/23 136/75  03/16/23 (!) 169/93    Physical Exam Vitals and nursing note reviewed.  Constitutional:      General: He is not in acute distress.    Appearance: He is well-developed. He is obese.  HENT:     Head: Normocephalic and atraumatic.  Neck:     Vascular: No carotid bruit.  Cardiovascular:     Rate and Rhythm: Normal rate and regular rhythm.  Pulmonary:     Effort: Pulmonary effort is normal. No respiratory distress.     Breath sounds: No wheezing or rhonchi.  Musculoskeletal:     Cervical back: Normal range of motion.     Right lower leg: No edema.     Left lower leg: No edema.  Lymphadenopathy:     Cervical: No cervical adenopathy.  Skin:    General: Skin is warm and dry.     Findings: No rash.  Neurological:     Mental Status: He is alert and oriented to person, place, and time.  Psychiatric:        Mood and Affect: Mood normal.         Behavior: Behavior normal.    Diabetic Foot Exam - Simple   Simple Foot Form Diabetic Foot exam was performed with the following findings: Yes 06/12/2023 10:01 AM  Visual Inspection No deformities, no ulcerations, no other skin breakdown bilaterally: Yes Sensation  Testing Intact to touch and monofilament testing bilaterally: Yes Pulse Check Posterior Tibialis and Dorsalis pulse intact bilaterally: Yes Comments      Wt Readings from Last 3 Encounters:  06/12/23 (!) 340 lb (154.2 kg)  04/14/23 230 lb (104.3 kg)  03/16/23 230 lb (104.3 kg)    BP 122/72 (BP Location: Right Arm, Cuff Size: Large)   Pulse (!) 55   Ht 5\' 11"  (1.803 m)   Wt (!) 340 lb (154.2 kg)   SpO2 97%   BMI 47.42 kg/m   Assessment and Plan:  Problem List Items Addressed This Visit       Unprioritized   Prediabetes    Last A1C was just into diabetic range. Diet changes and weight loss advised - 15 lbs before next visit. May need to start medications if not improved.      Relevant Orders   Comprehensive metabolic panel   Hemoglobin A1c   Primary hypertension - Primary    Controlled BP with normal exam. Current regimen is irbesartan, bystolic and amlodipine. Will continue same medications; encourage continued reduced sodium diet.       Relevant Medications   irbesartan (AVAPRO) 150 MG tablet   Nebivolol HCl (BYSTOLIC) 20 MG TABS   amLODipine (NORVASC) 10 MG tablet    Return in about 4 months (around 10/10/2023) for HTN, glucose.    Reubin Milan, MD [facility] [place]

## 2023-06-12 NOTE — Assessment & Plan Note (Signed)
Controlled BP with normal exam. Current regimen is irbesartan, bystolic and amlodipine. Will continue same medications; encourage continued reduced sodium diet.

## 2023-06-12 NOTE — Assessment & Plan Note (Addendum)
Last A1C was just into diabetic range. Diet changes and weight loss advised - 15 lbs before next visit. May need to start medications if not improved.

## 2023-10-09 ENCOUNTER — Ambulatory Visit (INDEPENDENT_AMBULATORY_CARE_PROVIDER_SITE_OTHER): Payer: 59 | Admitting: Internal Medicine

## 2023-10-09 ENCOUNTER — Encounter: Payer: Self-pay | Admitting: Internal Medicine

## 2023-10-09 VITALS — BP 132/78 | HR 66 | Ht 71.0 in | Wt 321.0 lb

## 2023-10-09 DIAGNOSIS — R7303 Prediabetes: Secondary | ICD-10-CM | POA: Diagnosis not present

## 2023-10-09 DIAGNOSIS — I10 Essential (primary) hypertension: Secondary | ICD-10-CM | POA: Diagnosis not present

## 2023-10-09 LAB — POCT GLYCOSYLATED HEMOGLOBIN (HGB A1C): Hemoglobin A1C: 5.3 % (ref 4.0–5.6)

## 2023-10-09 NOTE — Assessment & Plan Note (Addendum)
 Blood pressures is well controlled. Home readings average 130/75. Current medications avapro, bystolic and amlodipine. Will continue same regimen along with efforts to limit dietary sodium and lose weight.

## 2023-10-09 NOTE — Assessment & Plan Note (Signed)
 Doing great with diet changes, exercise and weight loss. A1C is now back in normal range.

## 2023-10-09 NOTE — Progress Notes (Signed)
 Date:  10/09/2023   Name:  Andrew Hodge   DOB:  June 20, 1961   MRN:  [id number]   Chief Complaint: Hypertension  Hypertension This is a chronic problem. The problem is controlled. Pertinent negatives include no chest pain, headaches, palpitations or shortness of breath. Past treatments include angiotensin blockers, calcium channel blockers and beta blockers. The current treatment provides significant improvement.    Review of Systems  Constitutional:  Negative for fatigue and unexpected weight change.  Eyes:  Negative for visual disturbance.  Respiratory:  Negative for cough, chest tightness, shortness of breath and wheezing.   Cardiovascular:  Negative for chest pain, palpitations and leg swelling.  Gastrointestinal:  Negative for abdominal pain, constipation and diarrhea.  Neurological:  Negative for dizziness, weakness, light-headedness and headaches.  Psychiatric/Behavioral:  Negative for dysphoric mood and sleep disturbance. The patient is not nervous/anxious.      Lab Results  Component Value Date   NA 134 (L) 06/12/2023   K 3.8 06/12/2023   CO2 25 06/12/2023   GLUCOSE 123 (H) 06/12/2023   BUN 13 06/12/2023   CREATININE 0.88 06/12/2023   CALCIUM 9.1 06/12/2023   EGFR 97 11/28/2022   GFRNONAA >60 06/12/2023   Lab Results  Component Value Date   CHOL 217 (H) 11/28/2022   HDL 41 11/28/2022   LDLCALC 147 (H) 11/28/2022   TRIG 162 (H) 11/28/2022   CHOLHDL 5.3 (H) 11/28/2022   Lab Results  Component Value Date   TSH 4.020 10/26/2018   Lab Results  Component Value Date   HGBA1C 5.3 10/09/2023   Lab Results  Component Value Date   WBC 5.2 11/28/2022   HGB 15.3 11/28/2022   HCT 44.8 11/28/2022   MCV 86 11/28/2022   PLT 231 11/28/2022   Lab Results  Component Value Date   ALT 21 06/12/2023   AST 18 06/12/2023   ALKPHOS 66 06/12/2023   BILITOT 0.7 06/12/2023   No results found for: "25OHVITD2", "25OHVITD3", "VD25OH"   Patient Active Problem List    Diagnosis Date Noted   Prediabetes 06/12/2023   Benign neoplasm of transverse colon    Hepatitis C, chronic (HCC) 10/24/2017   Primary hypertension 07/08/2016   Obesity, morbid (HCC) 06/26/2016    No Known Allergies  Past Surgical History:  Procedure Laterality Date   CLOSED MANIPULATION SHOULDER     dislocated shoulder - age 62   COLONOSCOPY WITH PROPOFOL N/A 03/18/2018   Procedure: COLONOSCOPY WITH PROPOFOL;  Surgeon: Midge Minium, MD;  Location[facility];  Service: Endoscopy;  Laterality: N/A;   POLYPECTOMY N/A 03/18/2018   Procedure: POLYPECTOMY;  Surgeon: Midge Minium, MD;  Location: [facility];  Service: Endoscopy;  Laterality: N/A;    Social History   Tobacco Use   Smoking status: Never   Smokeless tobacco: Never  Vaping Use   Vaping status: Never Used  Substance Use Topics   Alcohol use: Yes    Alcohol/week: 6.0 standard drinks of alcohol    Types: 6 Cans of beer per week    Comment: on weekends   Drug use: No     Medication list has been reviewed and updated.  Current Meds  Medication Sig   albuterol (VENTOLIN HFA) 108 (90 Base) MCG/ACT inhaler Inhale 1-2 puffs into the lungs every 6 (six) hours as needed for wheezing or shortness of breath.   amLODipine (NORVASC) 10 MG tablet Take 1 tablet (10 mg total) by mouth daily.   irbesartan (AVAPRO) 150 MG tablet  Take 1 tablet (150 mg total) by mouth daily.   Nebivolol HCl (BYSTOLIC) 20 MG TABS Take 1 tablet (20 mg total) by mouth daily.   sildenafil (VIAGRA) 100 MG tablet TAKE 0.5-1 TABLETS (50-100 MG TOTAL) BY MOUTH DAILY AS NEEDED FOR ERECTILE DYSFUNCTION.       10/09/2023   10:12 AM 06/12/2023    9:50 AM 07/28/2022    9:33 AM 05/19/2022    1:31 PM  GAD 7 : Generalized Anxiety Score  Nervous, Anxious, on Edge 0 0 0 0  Control/stop worrying 0 0 0 0  Worry too much - different things 0 0 0 0  Trouble relaxing 0 0 0 0  Restless 0 0 0 0  Easily annoyed or irritable 0 0 0 0  Afraid - awful  might happen 0 0 0 0  Total GAD 7 Score 0 0 0 0  Anxiety Difficulty Not difficult at all Not difficult at all Not difficult at all Not difficult at all       10/09/2023   10:12 AM 06/12/2023    9:50 AM 07/28/2022    9:31 AM  Depression screen PHQ 2/9  Decreased Interest 0 0 0  Down, Depressed, Hopeless 0 0 0  PHQ - 2 Score 0 0 0  Altered sleeping 0 0 0  Tired, decreased energy 0 0 0  Change in appetite 0 0 0  Feeling bad or failure about yourself  0 0 0  Trouble concentrating 0 0 0  Moving slowly or fidgety/restless 0 0 0  Suicidal thoughts 0 0 0  PHQ-9 Score 0 0 0  Difficult doing work/chores Not difficult at all Not difficult at all Not difficult at all    BP Readings from Last 3 Encounters:  10/09/23 132/78  06/12/23 122/72  04/14/23 136/75    Physical Exam Vitals and nursing note reviewed.  Constitutional:      General: He is not in acute distress.    Appearance: Normal appearance. He is well-developed.  HENT:     Head: Normocephalic and atraumatic.  Neck:     Vascular: No carotid bruit.  Cardiovascular:     Rate and Rhythm: Normal rate and regular rhythm.  Pulmonary:     Effort: Pulmonary effort is normal. No respiratory distress.     Breath sounds: No wheezing or rhonchi.  Musculoskeletal:     Cervical back: Normal range of motion.     Right lower leg: No edema.     Left lower leg: No edema.  Lymphadenopathy:     Cervical: No cervical adenopathy.  Skin:    General: Skin is warm and dry.     Findings: No rash.  Neurological:     General: No focal deficit present.     Mental Status: He is alert and oriented to person, place, and time.  Psychiatric:        Mood and Affect: Mood normal.        Behavior: Behavior normal.     Wt Readings from Last 3 Encounters:  10/09/23 (!) 321 lb (145.6 kg)  06/12/23 (!) 340 lb (154.2 kg)  04/14/23 230 lb (104.3 kg)    BP 132/78   Pulse 66   Ht 5\' 11"  (1.803 m)   Wt (!) 321 lb (145.6 kg)   SpO2 96%   BMI 44.77  kg/m   Assessment and Plan:  Problem List Items Addressed This Visit       Unprioritized   Primary hypertension - Primary  Blood pressures is well controlled. Home readings average 130/75. Current medications avapro, bystolic and amlodipine. Will continue same regimen along with efforts to limit dietary sodium and lose weight.       Prediabetes   Doing great with diet changes, exercise and weight loss. A1C is now back in normal range.      Relevant Orders   POCT glycosylated hemoglobin (Hb A1C) (Completed)    Return in about 6 months (around 04/10/2024) for CPX.    Reubin Milan, MD [facility] [place]

## 2024-03-17 ENCOUNTER — Other Ambulatory Visit: Payer: Self-pay | Admitting: Internal Medicine

## 2024-03-17 DIAGNOSIS — I10 Essential (primary) hypertension: Secondary | ICD-10-CM

## 2024-03-18 NOTE — Telephone Encounter (Signed)
 Requested Prescriptions  Pending Prescriptions Disp Refills   amLODipine  (NORVASC ) 10 MG tablet [Pharmacy Med Name: amLODIPine  Besylate 10 MG Oral Tablet] 90 tablet 0    Sig: Take 1 tablet by mouth once daily     Cardiovascular: Calcium Channel Blockers 2 Passed - 03/18/2024  3:31 PM      Passed - Last BP in normal range    BP Readings from Last 1 Encounters:  10/09/23 132/78         Passed - Last Heart Rate in normal range    Pulse Readings from Last 1 Encounters:  10/09/23 66         Passed - Valid encounter within last 6 months    Recent Outpatient Visits           5 months ago Primary hypertension   [place] [facility] at [facility], Leita DEL, MD       Future Appointments             In 3 weeks Justus, Leita DEL, MD [facility] at [facility], [phone] [place]

## 2024-04-12 ENCOUNTER — Encounter: Admitting: Internal Medicine

## 2024-05-06 ENCOUNTER — Other Ambulatory Visit: Payer: Self-pay | Admitting: Internal Medicine

## 2024-05-06 DIAGNOSIS — I10 Essential (primary) hypertension: Secondary | ICD-10-CM

## 2024-05-09 ENCOUNTER — Ambulatory Visit (INDEPENDENT_AMBULATORY_CARE_PROVIDER_SITE_OTHER): Admitting: Internal Medicine

## 2024-05-09 ENCOUNTER — Encounter: Payer: Self-pay | Admitting: Internal Medicine

## 2024-05-09 VITALS — BP 128/80 | HR 83 | Ht 71.0 in | Wt 319.0 lb

## 2024-05-09 DIAGNOSIS — I10 Essential (primary) hypertension: Secondary | ICD-10-CM

## 2024-05-09 DIAGNOSIS — Z1211 Encounter for screening for malignant neoplasm of colon: Secondary | ICD-10-CM

## 2024-05-09 DIAGNOSIS — Z125 Encounter for screening for malignant neoplasm of prostate: Secondary | ICD-10-CM | POA: Diagnosis not present

## 2024-05-09 DIAGNOSIS — B182 Chronic viral hepatitis C: Secondary | ICD-10-CM | POA: Diagnosis not present

## 2024-05-09 DIAGNOSIS — Z0000 Encounter for general adult medical examination without abnormal findings: Secondary | ICD-10-CM

## 2024-05-09 DIAGNOSIS — R7303 Prediabetes: Secondary | ICD-10-CM | POA: Diagnosis not present

## 2024-05-09 DIAGNOSIS — E785 Hyperlipidemia, unspecified: Secondary | ICD-10-CM | POA: Diagnosis not present

## 2024-05-09 MED ORDER — AMLODIPINE BESYLATE 10 MG PO TABS: 10.0000 mg | ORAL_TABLET | Freq: Every day | ORAL | 1 refills | Status: AC

## 2024-05-09 MED ORDER — NEBIVOLOL HCL 20 MG PO TABS: 20.0000 mg | ORAL_TABLET | Freq: Every day | ORAL | 1 refills | Status: AC

## 2024-05-09 MED ORDER — IRBESARTAN 150 MG PO TABS: 150.0000 mg | ORAL_TABLET | Freq: Every day | ORAL | 1 refills | Status: AC

## 2024-05-09 NOTE — Progress Notes (Signed)
 Date:  05/09/2024   Name:  Andrew Hodge   DOB:  [birth year]   MRN:  [id number]   Chief Complaint: Annual Exam [name] is a 63 y.o. male who presents today for his Complete Annual Exam. He feels well. He reports exercising walking. He reports he is sleeping well.   Health Maintenance  Topic Date Due   Colon Cancer Screening  03/19/2023   Zoster (Shingles) Vaccine (1 of 2) 08/09/2024*   Flu Shot  10/18/2024*   Pneumococcal Vaccine for age over 13 (1 of 2 - PCV) 05/09/2025*   Hepatitis B Vaccine (1 of 3 - Risk 3-dose series) 05/09/2025*   COVID-19 Vaccine (3 - 2025-26 season) 05/25/2025*   DTaP/Tdap/Td vaccine (2 - Td or Tdap) 12/11/2025   Hepatitis C Screening  Completed   HIV Screening  Completed   HPV Vaccine  Aged Out   Meningitis B Vaccine  Aged Out  *Topic was postponed. The date shown is not the original due date.   Lab Results  Component Value Date   PSA1 0.2 11/28/2022   PSA1 0.2 11/04/2021   PSA1 0.3 10/30/2020    Hypertension This is a chronic problem. The problem is controlled. Pertinent negatives include no chest pain, headaches, palpitations or shortness of breath. Past treatments include calcium channel blockers, angiotensin blockers and beta blockers. The current treatment provides significant improvement.  Hyperlipidemia This is a chronic problem. Recent lipid tests were reviewed and are high. Pertinent negatives include no chest pain or shortness of breath. He is currently on no antihyperlipidemic treatment.  Hep C - apparently treated with Epclusa last year.  Has not had any repeat labs since that time and no GI follow up.  He is also overdue for colonoscopy.  Review of Systems  Constitutional:  Negative for fatigue and unexpected weight change.  HENT:  Negative for nosebleeds.   Eyes:  Negative for visual disturbance.  Respiratory:  Negative for cough, chest tightness, shortness of breath and wheezing.   Cardiovascular:  Negative for chest  pain, palpitations and leg swelling.  Gastrointestinal:  Negative for abdominal pain, constipation and diarrhea.  Neurological:  Negative for dizziness, weakness, light-headedness and headaches.     Lab Results  Component Value Date   NA 134 (L) 06/12/2023   K 3.8 06/12/2023   CO2 25 06/12/2023   GLUCOSE 123 (H) 06/12/2023   BUN 13 06/12/2023   CREATININE 0.88 06/12/2023   CALCIUM 9.1 06/12/2023   EGFR 97 11/28/2022   GFRNONAA >60 06/12/2023   Lab Results  Component Value Date   CHOL 217 (H) 11/28/2022   HDL 41 11/28/2022   LDLCALC 147 (H) 11/28/2022   TRIG 162 (H) 11/28/2022   CHOLHDL 5.3 (H) 11/28/2022   Lab Results  Component Value Date   TSH 4.020 10/26/2018   Lab Results  Component Value Date   HGBA1C 5.3 10/09/2023   Lab Results  Component Value Date   WBC 5.2 11/28/2022   HGB 15.3 11/28/2022   HCT 44.8 11/28/2022   MCV 86 11/28/2022   PLT 231 11/28/2022   Lab Results  Component Value Date   ALT 21 06/12/2023   AST 18 06/12/2023   ALKPHOS 66 06/12/2023   BILITOT 0.7 06/12/2023   No results found for: MARIEN BOLLS, VD25OH   Patient Active Problem List   Diagnosis Date Noted   Mild hyperlipidemia 05/09/2024   Prediabetes 06/12/2023   Benign neoplasm of transverse colon    Hepatitis C, chronic (  HCC) 10/24/2017   Primary hypertension 07/08/2016   Obesity, morbid (HCC) 06/26/2016    No Known Allergies  Past Surgical History:  Procedure Laterality Date   CLOSED MANIPULATION SHOULDER     dislocated shoulder - age 15   COLONOSCOPY WITH PROPOFOL  N/A 03/18/2018   Procedure: COLONOSCOPY WITH PROPOFOL ;  Surgeon: Jinny Carmine, MD;  Location: [facility];  Service: Endoscopy;  Laterality: N/A;   POLYPECTOMY N/A 03/18/2018   Procedure: POLYPECTOMY;  Surgeon: Jinny Carmine, MD;  Location: [facility];  Service: Endoscopy;  Laterality: N/A;    Social History   Tobacco Use   Smoking status: Never   Smokeless tobacco: Never   Vaping Use   Vaping status: Never Used  Substance Use Topics   Alcohol use: Yes    Alcohol/week: 6.0 standard drinks of alcohol    Types: 6 Cans of beer per week    Comment: on weekends   Drug use: No     Medication list has been reviewed and updated.  Current Meds  Medication Sig   albuterol  (VENTOLIN  HFA) 108 (90 Base) MCG/ACT inhaler Inhale 1-2 puffs into the lungs every 6 (six) hours as needed for wheezing or shortness of breath.   sildenafil  (VIAGRA ) 100 MG tablet TAKE 0.5-1 TABLETS (50-100 MG TOTAL) BY MOUTH DAILY AS NEEDED FOR ERECTILE DYSFUNCTION.   [DISCONTINUED] amLODipine  (NORVASC ) 10 MG tablet Take 1 tablet by mouth once daily   [DISCONTINUED] irbesartan  (AVAPRO ) 150 MG tablet Take 1 tablet (150 mg total) by mouth daily.   [DISCONTINUED] Nebivolol  HCl (BYSTOLIC ) 20 MG TABS Take 1 tablet (20 mg total) by mouth daily.       05/09/2024   10:22 AM 10/09/2023   10:12 AM 06/12/2023    9:50 AM 07/28/2022    9:33 AM  GAD 7 : Generalized Anxiety Score  Nervous, Anxious, on Edge 0 0 0 0  Control/stop worrying 0 0 0 0  Worry too much - different things 0 0 0 0  Trouble relaxing 0 0 0 0  Restless 0 0 0 0  Easily annoyed or irritable 0 0 0 0  Afraid - awful might happen 0 0 0 0  Total GAD 7 Score 0 0 0 0  Anxiety Difficulty Not difficult at all Not difficult at all Not difficult at all Not difficult at all       05/09/2024   10:22 AM 10/09/2023   10:12 AM 06/12/2023    9:50 AM  Depression screen PHQ 2/9  Decreased Interest 0 0 0  Down, Depressed, Hopeless 0 0 0  PHQ - 2 Score 0 0 0  Altered sleeping 0 0 0  Tired, decreased energy 0 0 0  Change in appetite 0 0 0  Feeling bad or failure about yourself  0 0 0  Trouble concentrating 0 0 0  Moving slowly or fidgety/restless 0 0 0  Suicidal thoughts 0 0 0  PHQ-9 Score 0 0 0  Difficult doing work/chores Not difficult at all Not difficult at all Not difficult at all    BP Readings from Last 3 Encounters:  05/09/24  128/80  10/09/23 132/78  06/12/23 122/72    Physical Exam Vitals and nursing note reviewed.  Constitutional:      Appearance: Normal appearance. He is well-developed.  HENT:     Head: Normocephalic.     Right Ear: Tympanic membrane, ear canal and external ear normal.     Left Ear: Tympanic membrane, ear canal and external ear normal.  Nose: Nose normal.  Eyes:     Conjunctiva/sclera: Conjunctivae normal.     Pupils: Pupils are equal, round, and reactive to light.  Neck:     Thyroid: No thyromegaly.     Vascular: No carotid bruit.  Cardiovascular:     Rate and Rhythm: Normal rate and regular rhythm.     Heart sounds: Normal heart sounds.  Pulmonary:     Effort: Pulmonary effort is normal.     Breath sounds: Normal breath sounds. No wheezing.  Chest:  Breasts:    Right: No mass.     Left: No mass.  Abdominal:     General: Bowel sounds are normal.     Palpations: Abdomen is soft.     Tenderness: There is no abdominal tenderness.  Musculoskeletal:        General: Normal range of motion.     Cervical back: Normal range of motion and neck supple.  Lymphadenopathy:     Cervical: No cervical adenopathy.  Skin:    General: Skin is warm and dry.  Neurological:     Mental Status: He is alert and oriented to person, place, and time.     Deep Tendon Reflexes: Reflexes are normal and symmetric.  Psychiatric:        Attention and Perception: Attention normal.        Mood and Affect: Mood normal.        Thought Content: Thought content normal.     Wt Readings from Last 3 Encounters:  05/09/24 (!) 319 lb (144.7 kg)  10/09/23 (!) 321 lb (145.6 kg)  06/12/23 (!) 340 lb (154.2 kg)    BP 128/80   Pulse 83   Ht 5' 11 (1.803 m)   Wt (!) 319 lb (144.7 kg)   SpO2 96%   BMI 44.49 kg/m   Assessment and Plan:  Problem List Items Addressed This Visit       Unprioritized   Primary hypertension   Well controlled blood pressure today. Current regimen is irbesartan ,  bystolic  and amlodipine . No medication side effects noted.        Relevant Medications   Nebivolol  HCl (BYSTOLIC ) 20 MG TABS   irbesartan  (AVAPRO ) 150 MG tablet   amLODipine  (NORVASC ) 10 MG tablet   Other Relevant Orders   CBC with Differential/Platelet   Comprehensive metabolic panel with GFR   Urinalysis, Routine w reflex microscopic   Hepatitis C, chronic (HCC)   Treated with Epclusa last year for ? 8 weeks Will recheck labs today and refer to GI if needed. Will also obtain RUQ US  and elastography      Relevant Orders   Comprehensive metabolic panel with GFR   HCV RNA quant   US  ABDOMEN RUQ W/ELASTOGRAPHY   Prediabetes   Lab Results  Component Value Date   HGBA1C 5.3 10/09/2023  Doing very well with diet changes.      Relevant Orders   Hemoglobin A1c   Mild hyperlipidemia   LDL is  Lab Results  Component Value Date   LDLCALC 147 (H) 11/28/2022    Current medication regimen is diet only.  He has cut out pork which has also helped his HTN control. Goal LDL is < 130.       Relevant Medications   Nebivolol  HCl (BYSTOLIC ) 20 MG TABS   irbesartan  (AVAPRO ) 150 MG tablet   amLODipine  (NORVASC ) 10 MG tablet   Other Relevant Orders   Lipid panel   Other Visit Diagnoses  Annual physical exam    -  Primary   Relevant Orders   CBC with Differential/Platelet   Comprehensive metabolic panel with GFR   Hemoglobin A1c   Lipid panel   PSA   HCV RNA quant     Colon cancer screening       TA in 2019 - due for repeat   Relevant Orders   Ambulatory referral to Gastroenterology     Prostate cancer screening       Relevant Orders   PSA       Return in about 6 months (around 11/07/2024) for TOC HTN Dr. Sol.    Leita HILARIO Adie, MD [facility] [place]

## 2024-05-09 NOTE — Assessment & Plan Note (Signed)
 Well controlled blood pressure today. Current regimen is irbesartan , bystolic  and amlodipine . No medication side effects noted.

## 2024-05-09 NOTE — Assessment & Plan Note (Signed)
 LDL is  Lab Results  Component Value Date   LDLCALC 147 (H) 11/28/2022    Current medication regimen is diet only.  He has cut out pork which has also helped his HTN control. Goal LDL is < 130.

## 2024-05-09 NOTE — Assessment & Plan Note (Addendum)
 Treated with Epclusa last year for ? 8 weeks Will recheck labs today and refer to GI if needed. Will also obtain RUQ US  and elastography

## 2024-05-09 NOTE — Assessment & Plan Note (Signed)
 Lab Results  Component Value Date   HGBA1C 5.3 10/09/2023  Doing very well with diet changes.

## 2024-05-09 NOTE — Telephone Encounter (Signed)
 Requested Prescriptions  Refused Prescriptions Disp Refills   Nebivolol  HCl 20 MG TABS [Pharmacy Med Name: Nebivolol  HCl 20 MG Oral Tablet] 90 tablet 0    Sig: Take 1 tablet by mouth once daily     Cardiovascular: Beta Blockers 3 Passed - 05/09/2024  2:43 PM      Passed - Cr in normal range and within 360 days    Creatinine, Ser  Date Value Ref Range Status  06/12/2023 0.88 0.61 - 1.24 mg/dL Final         Passed - AST in normal range and within 360 days    AST  Date Value Ref Range Status  06/12/2023 18 15 - 41 U/L Final         Passed - ALT in normal range and within 360 days    ALT  Date Value Ref Range Status  06/12/2023 21 0 - 44 U/L Final   ALT (SGPT) P5P  Date Value Ref Range Status  05/15/2022 58 (H) 0 - 55 IU/L Final         Passed - Last BP in normal range    BP Readings from Last 1 Encounters:  05/09/24 128/80         Passed - Last Heart Rate in normal range    Pulse Readings from Last 1 Encounters:  05/09/24 83         Passed - Valid encounter within last 6 months    Recent Outpatient Visits           Today Annual physical exam   [facility] at [facility], Leita DEL, MD   7 months ago Primary hypertension   [facility] at [facility], Leita DEL, MD

## 2024-05-10 ENCOUNTER — Ambulatory Visit: Payer: Self-pay | Admitting: Internal Medicine

## 2024-05-10 LAB — COMPREHENSIVE METABOLIC PANEL WITH GFR
ALT: 21 IU/L (ref 0–44)
AST: 13 IU/L (ref 0–40)
Albumin: 4.3 g/dL (ref 3.9–4.9)
Alkaline Phosphatase: 100 IU/L (ref 47–123)
BUN/Creatinine Ratio: 14 (ref 10–24)
BUN: 12 mg/dL (ref 8–27)
Bilirubin Total: 0.4 mg/dL (ref 0.0–1.2)
CO2: 22 mmol/L (ref 20–29)
Calcium: 9.4 mg/dL (ref 8.6–10.2)
Chloride: 108 mmol/L — ABNORMAL HIGH (ref 96–106)
Creatinine, Ser: 0.85 mg/dL (ref 0.76–1.27)
Globulin, Total: 2.7 g/dL (ref 1.5–4.5)
Glucose: 115 mg/dL — ABNORMAL HIGH (ref 70–99)
Potassium: 4.5 mmol/L (ref 3.5–5.2)
Sodium: 144 mmol/L (ref 134–144)
Total Protein: 7 g/dL (ref 6.0–8.5)
eGFR: 98 mL/min/1.73 (ref >59)

## 2024-05-10 LAB — LIPID PANEL
Chol/HDL Ratio: 4.2 ratio (ref 0.0–5.0)
Cholesterol, Total: 191 mg/dL (ref 100–199)
HDL: 45 mg/dL (ref >39)
LDL Chol Calc (NIH): 129 mg/dL — ABNORMAL HIGH (ref 0–99)
Triglycerides: 96 mg/dL (ref 0–149)
VLDL Cholesterol Cal: 17 mg/dL (ref 5–40)

## 2024-05-10 LAB — CBC WITH DIFFERENTIAL/PLATELET
Basophils Absolute: 0 x10E3/uL (ref 0.0–0.2)
Basos: 1 %
EOS (ABSOLUTE): 0.2 x10E3/uL (ref 0.0–0.4)
Eos: 4 %
Hematocrit: 49.4 % (ref 37.5–51.0)
Hemoglobin: 15.6 g/dL (ref 13.0–17.7)
Immature Grans (Abs): 0 x10E3/uL (ref 0.0–0.1)
Immature Granulocytes: 0 %
Lymphocytes Absolute: 1.7 x10E3/uL (ref 0.7–3.1)
Lymphs: 33 %
MCH: 28.7 pg (ref 26.6–33.0)
MCHC: 31.6 g/dL (ref 31.5–35.7)
MCV: 91 fL (ref 79–97)
Monocytes Absolute: 0.6 x10E3/uL (ref 0.1–0.9)
Monocytes: 11 %
Neutrophils Absolute: 2.7 x10E3/uL (ref 1.4–7.0)
Neutrophils: 51 %
Platelets: 245 x10E3/uL (ref 150–450)
RBC: 5.44 x10E6/uL (ref 4.14–5.80)
RDW: 12.9 % (ref 11.6–15.4)
WBC: 5.3 x10E3/uL (ref 3.4–10.8)

## 2024-05-10 LAB — URINALYSIS, ROUTINE W REFLEX MICROSCOPIC
Bilirubin, UA: NEGATIVE
Glucose, UA: NEGATIVE
Ketones, UA: NEGATIVE
Leukocytes,UA: NEGATIVE
Nitrite, UA: NEGATIVE
Protein,UA: NEGATIVE
RBC, UA: NEGATIVE
Specific Gravity, UA: 1.019 (ref 1.005–1.030)
Urobilinogen, Ur: 0.2 mg/dL (ref 0.2–1.0)
pH, UA: 6 (ref 5.0–7.5)

## 2024-05-10 LAB — HEMOGLOBIN A1C
Est. average glucose Bld gHb Est-mCnc: 123 mg/dL
Hgb A1c MFr Bld: 5.9 % — ABNORMAL HIGH (ref 4.8–5.6)

## 2024-05-10 LAB — HCV RNA QUANT: Hepatitis C Quantitation: NOT DETECTED [IU]/mL

## 2024-05-10 LAB — PSA: Prostate Specific Ag, Serum: 0.1 ng/mL (ref 0.0–4.0)

## 2024-05-13 ENCOUNTER — Other Ambulatory Visit

## 2024-06-01 ENCOUNTER — Ambulatory Visit: Attending: Internal Medicine

## 2024-06-09 ENCOUNTER — Telehealth: Payer: Self-pay

## 2024-06-09 NOTE — Telephone Encounter (Signed)
 Pt is not ready to schedule his colonoscopy.  Reminder letter mailed.  Thanks,  [place], CMA

## 2024-11-07 ENCOUNTER — Encounter: Admitting: Family Medicine
